# Patient Record
Sex: Male | Born: 1955 | Race: White | Hispanic: No | Marital: Married | Smoking: Never smoker
Health system: Southern US, Community
[De-identification: ages and names within clinical notes are randomized; demographics above are authoritative.]

## PROBLEM LIST (undated history)

## (undated) DIAGNOSIS — G2581 Restless legs syndrome: Secondary | ICD-10-CM

## (undated) DIAGNOSIS — G4733 Obstructive sleep apnea (adult) (pediatric): Secondary | ICD-10-CM

## (undated) HISTORY — DX: Restless legs syndrome: G25.81

## (undated) HISTORY — DX: Obstructive sleep apnea (adult) (pediatric): G47.33

## (undated) HISTORY — PX: KNEE ARTHROSCOPY: SHX127

---

## 2007-03-16 ENCOUNTER — Ambulatory Visit (HOSPITAL_BASED_OUTPATIENT_CLINIC_OR_DEPARTMENT_OTHER): Admission: RE | Admit: 2007-03-16 | Discharge: 2007-03-16 | Payer: Self-pay | Admitting: Otolaryngology

## 2007-03-22 ENCOUNTER — Ambulatory Visit: Payer: Self-pay | Admitting: Internal Medicine

## 2010-11-21 NOTE — Procedures (Signed)
NAME:  BRAVLIO, LUCA NO.:  [id number]   MEDICAL RECORD NO.:  [id number]          PATIENT TYPE:  OUT   LOCATION:  SLEEP CENTER                 FACILITY:  [facility]   PHYSICIAN:  Clinton D. Young, MD, FCCP, FACPDATE OF BIRTH:  03-Nov-1955   DATE OF STUDY:  03/16/2007                            NOCTURNAL POLYSOMNOGRAM   REFERRING PHYSICIAN:   INDICATION FOR STUDY:  Hypersomnia with sleep apnea, Epworth sleepiness  score 20/24, BMI 3.6, weight of 190 pounds, no home medications are  listed.   SLEEP ARCHITECTURE:  Total sleep time 289 minutes with sleep efficiency  71%.  Stage one was 11%, stage two was 77%, stage three was 1%, REM 10%  of total sleep time.  Sleep latency was 26%, REM latency 90%, awake  after sleep onset 97 minutes, arousal index increased to 23.4 indicating  increased EEG arousal.  No bedtime medication was taken.   RESPIRATORY DATA:  Split study protocol.  Apnea hypopnea index (AHI RDI)  37.9 obstructive events per hour, indicating moderately severe  obstructive sleep apnea/hypopnea syndrome before CPAP.  There were 17  obstructive apneas, one mixed apnea, and 72 hypopnea before CPAP.  The  events were predominantly while supine with most sleep before CPAP  introduction, also supine.  REM AHI 0.  CPAP was titrated to 8 CWP, AHI  0 per hour.  A small ResMed mirage micro nasal mask was used with heated  humidifier.   OXYGEN DATA:  Moderately loud snoring before CPAP with oxygen  desaturation 80 or 90%.  After CPAP control, saturation held 96% on room  air.   CARDIAC DATA:  Normal sinus rhythm.   MOVEMENT/PARASOMNIA:  Occasional limb jerk with insignificant impact on  sleep, no bathroom trips.   IMPRESSION/RECOMMENDATION:  1. Moderate obstructive sleep apnea/hypopnea syndrome, AHI 37.9 per      hour.  Positional events mostly recorded while supine, moderately      loud snoring with oxygen desaturating 80 or 90%.  2. Successful CPAP titration to  HCWP, AHI of 0 per hour.  A small      ResMed Mirage micro nasal mask was used with a heated humidifier.      Clinton D. Maple Hudson, MD, [facility], FACP  Diplomate, Biomedical engineer of Sleep Medicine  Electronically Signed     CDY/MEDQ  D:  03/22/2007 13:36:16  T:  03/23/2007 10:43:47  Job:  [id number]   cc:   Kristine Garbe. Ezzard Standing, M.D.  Fax: [phone]   Clinton D. Maple Hudson, MD, FCCP, FACP  [facility]-Pulmonary [facility]  [address]  [place]  [place] [address]

## 2013-06-30 ENCOUNTER — Ambulatory Visit: Payer: Self-pay | Admitting: Family Medicine

## 2014-06-20 ENCOUNTER — Ambulatory Visit: Payer: Self-pay

## 2018-02-08 ENCOUNTER — Ambulatory Visit
Admission: EM | Admit: 2018-02-08 | Discharge: 2018-02-08 | Disposition: A | Discharge status: Home / Self Care | Payer: BC Managed Care – PPO | Attending: Family Medicine | Admitting: Family Medicine

## 2018-02-08 ENCOUNTER — Other Ambulatory Visit: Payer: Self-pay

## 2018-02-08 ENCOUNTER — Ambulatory Visit: Payer: BC Managed Care – PPO

## 2018-02-08 DIAGNOSIS — M25531 Pain in right wrist: Secondary | ICD-10-CM

## 2018-02-08 DIAGNOSIS — M19039 Primary osteoarthritis, unspecified wrist: Secondary | ICD-10-CM

## 2018-02-08 DIAGNOSIS — X500XXA Overexertion from strenuous movement or load, initial encounter: Secondary | ICD-10-CM

## 2018-02-08 DIAGNOSIS — M19031 Primary osteoarthritis, right wrist: Secondary | ICD-10-CM

## 2018-02-08 MED ORDER — NAPROXEN 500 MG PO TABS: 500.0000 mg | ORAL_TABLET | Freq: Two times a day (BID) | ORAL | 0 refills | Status: DC

## 2018-02-08 MED ORDER — TRAMADOL HCL 50 MG PO TABS: 50.0000 mg | ORAL_TABLET | Freq: Four times a day (QID) | ORAL | 0 refills | Status: DC | PRN

## 2018-02-08 NOTE — Discharge Instructions (Signed)
Please wear thumb spica splint for 2 weeks and take naproxen twice daily with food for 2 weeks.  After 2 weeks transition of the brace if continued pain follow-up with primary care provider or orthopedics.  Use tramadol as needed for severe pain.

## 2018-02-08 NOTE — ED Provider Notes (Signed)
[facility]    CSN: [id number] Arrival date & time: 02/08/18  1014     History   Chief Complaint Chief Complaint  Patient presents with  . Wrist Pain    HPI Nathaniel Shaw is a 62 y.o. male presents to the urgent care facility for evaluation of right wrist pain.  Patient states yesterday he was lifting up a drive shaft when 60 to 80 pounds hyperextended his right wrist and he developed some pain and swelling throughout the wrist.  Patient states he had some discomfort and took Bayer aspirin.  Pain improved throughout the night but around 2 AM this morning pain returned.  Pain is been moderate.  No numbness or tingling.  Pain is throughout the dorsal aspect of the wrist and throughout the right hand.  He denies any other pain or injury throughout his body. HPI  History reviewed. No pertinent past medical history.  There are no active problems to display for this patient.   Past Surgical History:  Procedure Laterality Date  . KNEE ARTHROSCOPY         Home Medications    Prior to Admission medications   Medication Sig Start Date End Date Taking? Authorizing Provider  Aspirin-Caffeine (BAYER BACK & BODY PO) Take by mouth.   Yes [provider]  naproxen (NAPROSYN) 500 MG tablet Take 1 tablet (500 mg total) by mouth 2 (two) times daily. 02/08/18   Duanne Guess, PA-C  traMADol (ULTRAM) 50 MG tablet Take 1 tablet (50 mg total) by mouth every 6 (six) hours as needed. 02/08/18   Duanne Guess, PA-C    Family History Family History  Problem Relation Age of Onset  . Cancer Mother   . Healthy Father     Social History Social History   Tobacco Use  . Smoking status: Never Smoker  . Smokeless tobacco: Never Used  Substance Use Topics  . Alcohol use: Yes    Comment: occasional  . Drug use: Never     Allergies   Patient has no known allergies.   Review of Systems Review of Systems  Constitutional: Negative for fever.  Musculoskeletal:  Positive for arthralgias and joint swelling. Negative for back pain, gait problem, neck pain and neck stiffness.  Skin: Negative for rash and wound.  Neurological: Negative for numbness.     Physical Exam Triage Vital Signs ED Triage Vitals  Enc Vitals Group     BP 02/08/18 1027 (!) 145/95     Pulse Rate 02/08/18 1027 64     Resp 02/08/18 1027 16     Temp 02/08/18 1027 98.3 F (36.8 C)     Temp Source 02/08/18 1027 Temporal     SpO2 02/08/18 1027 100 %     Weight 02/08/18 1030 210 lb (95.3 kg)     Height 02/08/18 1030 5\' 9"  (1.753 m)     Head Circumference --      Peak Flow --      Pain Score 02/08/18 1028 7     Pain Loc --      Pain Edu? --      Excl. in [place]? --    No data found.  Updated Vital Signs BP (!) 145/95 (BP Location: Left Arm)   Pulse 64   Temp 98.3 F (36.8 C) (Temporal)   Resp 16   Ht 5\' 9"  (1.753 m)   Wt 210 lb (95.3 kg)   SpO2 100%   BMI 31.01 kg/m  Visual Acuity Right Eye Distance:   Left Eye Distance:   Bilateral Distance:    Right Eye Near:   Left Eye Near:    Bilateral Near:     Physical Exam  Constitutional: He is oriented to person, place, and time. He appears well-developed and well-nourished.  HENT:  Head: Normocephalic and atraumatic.  Eyes: Conjunctivae are normal.  Neck: Normal range of motion.  Cardiovascular: Normal rate.  Pulmonary/Chest: Effort normal. No respiratory distress.  Musculoskeletal: Normal range of motion.  Examination right wrist shows mild swelling throughout the wrist hand and digits.  There is no warmth erythema.  No skin breakdown noted.  Patient has good wrist range of motion with no significant pain with wrist range of motion.  There is no tendon deficits throughout the digits.  Patient is nontender throughout the forearm or elbow.  He has good range of motion of the elbow.  2+ radial pulse and 2+ cap refill.  Neurological: He is alert and oriented to person, place, and time.  Skin: Skin is warm. No rash  noted.  Psychiatric: He has a normal mood and affect. His behavior is normal. Thought content normal.     UC Treatments / Results  Labs (all labs ordered are listed, but only abnormal results are displayed) Labs Reviewed - No data to display  EKG None  Radiology Dg Wrist Complete Right  Result Date: 02/08/2018 CLINICAL DATA:  Right joint pain following hyperextension injury 2 days ago, initial encounter EXAM: RIGHT WRIST - COMPLETE 3+ VIEW COMPARISON:  None. FINDINGS: Degenerative changes are noted in the lateral aspect of the carpal bones. No acute fracture or dislocation is noted. Mild chondral calcifications are noted. No acute soft tissue abnormality is seen. IMPRESSION: Degenerative change without acute abnormality. Electronically Signed   By: Inez Catalina M.D.   On: 02/08/2018 11:09    Procedures Splint Application Date/Time: 09/14/4534 11:26 AM Performed by: Duanne Guess, PA-C Authorized by: Coral Spikes, DO   Consent:    Consent obtained:  Verbal   Consent given by:  Patient   Alternatives discussed:  No treatment Pre-procedure details:    Sensation:  Normal Procedure details:    Laterality:  Right   Location:  Wrist   Wrist:  R wrist   Strapping: no     Splint type:  Wrist   Supplies:  Prefabricated splint Post-procedure details:    Pain:  Improved   Sensation:  Normal   Patient tolerance of procedure:  Tolerated well, no immediate complications   (including critical care time)  Medications Ordered in UC Medications - No data to display  Initial Impression / Assessment and Plan / UC Course  I have reviewed the triage vital signs and the nursing notes.  Pertinent labs & imaging results that were available during my care of the patient were reviewed by me and considered in my medical decision making (see chart for details).     62 year old male with right wrist pain.  X-rays showed scaphotrapezial osteoarthritis with no evidence of acute bony  abnormality.  He is placed into a thumb spica splint.  He is placed on naproxen and given tramadol for nighttime pain as needed.  He is encouraged to continue with bracing and follow-up with orthopedics as needed if no improvement. Final Clinical Impressions(s) / UC Diagnoses   Final diagnoses:  Localized osteoarthritis of wrist     Discharge Instructions     Please wear thumb spica splint for 2 weeks and take  naproxen twice daily with food for 2 weeks.  After 2 weeks transition of the brace if continued pain follow-up with primary care provider or orthopedics.  Use tramadol as needed for severe pain.    ED Prescriptions    Medication Sig Dispense Auth. Provider   naproxen (NAPROSYN) 500 MG tablet Take 1 tablet (500 mg total) by mouth 2 (two) times daily. 30 tablet Duanne Guess, PA-C   traMADol (ULTRAM) 50 MG tablet Take 1 tablet (50 mg total) by mouth every 6 (six) hours as needed. 15 tablet Renata Caprice       Duanne Guess, [place] 02/08/18 1133

## 2018-02-08 NOTE — ED Triage Notes (Signed)
Patient dropped a drive shaft on his hand and it hyperflexed his wrist.

## 2018-07-14 ENCOUNTER — Other Ambulatory Visit: Payer: Self-pay

## 2018-07-14 ENCOUNTER — Encounter: Payer: Self-pay | Admitting: Emergency Medicine

## 2018-07-14 ENCOUNTER — Ambulatory Visit
Admission: EM | Admit: 2018-07-14 | Discharge: 2018-07-14 | Disposition: A | Discharge status: Home / Self Care | Payer: BC Managed Care – PPO | Attending: Family Medicine | Admitting: Family Medicine

## 2018-07-14 DIAGNOSIS — W268XXA Contact with other sharp object(s), not elsewhere classified, initial encounter: Secondary | ICD-10-CM | POA: Diagnosis not present

## 2018-07-14 DIAGNOSIS — S61213A Laceration without foreign body of left middle finger without damage to nail, initial encounter: Secondary | ICD-10-CM | POA: Insufficient documentation

## 2018-07-14 DIAGNOSIS — Z23 Encounter for immunization: Secondary | ICD-10-CM

## 2018-07-14 MED ORDER — TETANUS-DIPHTH-ACELL PERTUSSIS 5-2.5-18.5 LF-MCG/0.5 IM SUSP
0.5000 mL | Freq: Once | INTRAMUSCULAR | Status: AC
  Administered 2018-07-14: 0.5 mL via INTRAMUSCULAR

## 2018-07-14 MED ORDER — CEPHALEXIN 500 MG PO CAPS: 500.0000 mg | ORAL_CAPSULE | Freq: Three times a day (TID) | ORAL | 0 refills | Status: AC

## 2018-07-14 NOTE — ED Triage Notes (Signed)
Patient c/o left middle finger laceration this morning. He states he got it caught in a piece of metal.

## 2018-07-14 NOTE — ED Provider Notes (Signed)
[facility] ____________________________________________  Time seen: Approximately 2:17 PM  I have reviewed the triage vital signs and the nursing notes.   HISTORY  Chief Complaint Extremity Laceration   HPI Nathaniel Shaw is a 63 y.o. male presenting for evaluation of left middle finger laceration that occurred approximately 10 AM this morning.  Reports that this did occur at work, but declines Worker's Compensation injury.  States he was working on a new part and a piece of metal came down catching his skin causing a laceration.  States minimal discomfort to the area.  Denies any decreased range of motion, paresthesias or other discomfort.  Denies other injury.  Unsure of last tetanus immunization.  Right-hand-dominant.  Reports otherwise doing well denies other complaints.  Did apply cleaning solution on area prior to arrival.  No other alleviating measures attempted.  Denies other complaints.  [place]: PCP   History reviewed. No pertinent past medical history. Denies   There are no active problems to display for this patient.   Past Surgical History:  Procedure Laterality Date  . KNEE ARTHROSCOPY       No current facility-administered medications for this encounter.   Current Outpatient Medications:  .  cephALEXin (KEFLEX) 500 MG capsule, Take 1 capsule (500 mg total) by mouth 3 (three) times daily for 5 days., Disp: 15 capsule, Rfl: 0  Allergies Patient has no known allergies.  Family History  Problem Relation Age of Onset  . Cancer Mother   . Healthy Father     Social History Social History   Tobacco Use  . Smoking status: Never Smoker  . Smokeless tobacco: Never Used  Substance Use Topics  . Alcohol use: Yes    Comment: occasional  . Drug use: Never    Review of Systems Constitutional: No fever Cardiovascular: Denies chest pain. Respiratory: Denies shortness of breath. Gastrointestinal: No abdominal pain.     Musculoskeletal: Negative for back pain. Skin: as above.  ____________________________________________   PHYSICAL EXAM:  VITAL SIGNS: ED Triage Vitals  Enc Vitals Group     BP 07/14/18 1244 (!) 161/88     Pulse Rate 07/14/18 1244 67     Resp 07/14/18 1244 18     Temp 07/14/18 1244 98.3 F (36.8 C)     Temp Source 07/14/18 1244 Oral     SpO2 07/14/18 1244 99 %     Weight 07/14/18 1242 200 lb (90.7 kg)     Height 07/14/18 1242 5\' 9"  (1.753 m)     Head Circumference --      Peak Flow --      Pain Score 07/14/18 1242 0     Pain Loc --      Pain Edu? --      Excl. in [place]? --     Constitutional: Alert and oriented. Well appearing and in no acute distress.      Head: Normocephalic and atraumatic. Cardiovascular: Normal rate, regular rhythm. Grossly normal heart sounds.  Good peripheral circulation. Respiratory: Normal respiratory effort without tachypnea nor retractions. Breath sounds are clear and equal bilaterally. No wheezes, rales, rhonchi. Musculoskeletal: Steady gait. Neurologic:  Normal speech and language. Speech is normal. No gait instability.  Skin:  Skin is warm, dry. Except: Left distal middle phalanx volar aspect 1.5 cm superficial flap-like laceration present, no active bleeding, no foreign body found, nontender, no bony tenderness, normal distal sensation and capillary refill, full range of motion present, no motor or tendon deficit noted, skin  otherwise intact to hand, no injury to nail. Psychiatric: Mood and affect are normal. Speech and behavior are normal. Patient exhibits appropriate insight and judgment   ___________________________________________   LABS (all labs ordered are listed, but only abnormal results are displayed)  Labs Reviewed - No data to display ____________________________________________  PROCEDURES Procedures   Procedure(s) performed:  Procedure explained and verbal consent obtained. Consent: Verbal consent obtained. Written consent  not obtained. Risks and benefits: risks, benefits and alternatives were discussed Patient identity confirmed: verbally with patient and hospital-assigned identification number  Consent given by: patient   Laceration Repair Location: Left middle finger Length: 1.5 cm. Foreign bodies: no foreign bodies Tendon involvement: none Nerve involvement: none Preparation: Patient was prepped and draped in the usual sterile fashion. Anesthesia none  Cleaned with Betadine Irrigation solution: saline Irrigation method: jet lavage Amount of cleaning: copious Repaired with 1 Steri-Strip and Dermabond. Patient tolerate well. Wound well approximated post repair.  Antibiotic ointment and dressing applied.  Wound care instructions provided.  Observe for any signs of infection or other problems.     INITIAL IMPRESSION / ASSESSMENT AND PLAN / ED COURSE  Pertinent labs & imaging results that were available during my care of the patient were reviewed by me and considered in my medical decision making (see chart for details).  Well-appearing patient.  No acute distress.  Finger laceration cleaned, irrigated and repaired as above.  Will empirically place patient on 5-day course of Keflex.  Discussed keeping clean and dry.  Supportive care and monitoring.  Discussed return parameters.Discussed indication, risks and benefits of medications with patient.  Tetanus immunization updated.  Discussed follow up with Primary care physician this week as needed. Discussed follow up and return parameters including no resolution or any worsening concerns. Patient verbalized understanding and agreed to plan.   ____________________________________________   FINAL CLINICAL IMPRESSION(S) / ED DIAGNOSES  Final diagnoses:  Laceration of left middle finger without foreign body without damage to nail, initial encounter     ED Discharge Orders         Ordered    cephALEXin (KEFLEX) 500 MG capsule  3 times daily      07/14/18 1412           Note: This dictation was prepared with Dragon dictation along with smaller phrase technology. Any transcriptional errors that result from this process are unintentional.         Marylene Land, NP 07/14/18 1432

## 2018-07-14 NOTE — Discharge Instructions (Addendum)
Take medication as prescribed. Rest area. Keep clean and dry as discussed.   Follow up with your primary care physician this week as needed. Return to Urgent care for new or worsening concerns.

## 2019-10-16 ENCOUNTER — Ambulatory Visit: Payer: BC Managed Care – PPO | Attending: Internal Medicine

## 2019-10-16 DIAGNOSIS — Z23 Encounter for immunization: Secondary | ICD-10-CM

## 2019-10-16 NOTE — Progress Notes (Signed)
   Covid-19 Vaccination Clinic  Name:  Nathaniel Shaw    MRN: [id number] DOB: 1955/09/20  10/16/2019  Nathaniel Shaw was observed post Covid-19 immunization for 15 minutes without incident. He was provided with Vaccine Information Sheet and instruction to access the V-Safe system.   Nathaniel Shaw was instructed to call 911 with any severe reactions post vaccine: Marland Kitchen Difficulty breathing  . Swelling of face and throat  . A fast heartbeat  . A bad rash all over body  . Dizziness and weakness   Immunizations Administered    Name Date Dose VIS Date Route   Pfizer COVID-19 Vaccine 10/16/2019  9:42 AM 0.3 mL 06/19/2019 Intramuscular   Manufacturer: [place]   Lot: [id number]   [place]: [id number]

## 2019-11-11 ENCOUNTER — Ambulatory Visit: Payer: BC Managed Care – PPO | Attending: Internal Medicine

## 2019-11-11 ENCOUNTER — Other Ambulatory Visit: Payer: Self-pay

## 2019-11-11 DIAGNOSIS — Z23 Encounter for immunization: Secondary | ICD-10-CM

## 2019-11-11 NOTE — Progress Notes (Signed)
   Covid-19 Vaccination Clinic  Name:  Nathaniel Shaw    MRN: [id number] DOB: Dec 15, 1955  11/11/2019  Nathaniel Shaw was observed post Covid-19 immunization for 15 minutes without incident. He was provided with Vaccine Information Sheet and instruction to access the V-Safe system.   Nathaniel Shaw was instructed to call 911 with any severe reactions post vaccine: Marland Kitchen Difficulty breathing  . Swelling of face and throat  . A fast heartbeat  . A bad rash all over body  . Dizziness and weakness   Immunizations Administered    Name Date Dose VIS Date Route   Pfizer COVID-19 Vaccine 11/11/2019 10:01 AM 0.3 mL 09/02/2018 Intramuscular   Manufacturer: [place]   Lot: [id number]   [place]: [id number]

## 2019-12-15 ENCOUNTER — Encounter: Payer: Self-pay | Admitting: Emergency Medicine

## 2019-12-15 ENCOUNTER — Ambulatory Visit
Admission: EM | Admit: 2019-12-15 | Discharge: 2019-12-15 | Disposition: A | Discharge status: Home / Self Care | Payer: BC Managed Care – PPO | Attending: Emergency Medicine | Admitting: Emergency Medicine

## 2019-12-15 ENCOUNTER — Other Ambulatory Visit: Payer: Self-pay

## 2019-12-15 DIAGNOSIS — R0981 Nasal congestion: Secondary | ICD-10-CM | POA: Diagnosis present

## 2019-12-15 DIAGNOSIS — Z79899 Other long term (current) drug therapy: Secondary | ICD-10-CM | POA: Insufficient documentation

## 2019-12-15 DIAGNOSIS — Z20822 Contact with and (suspected) exposure to covid-19: Secondary | ICD-10-CM | POA: Diagnosis not present

## 2019-12-15 DIAGNOSIS — J302 Other seasonal allergic rhinitis: Secondary | ICD-10-CM

## 2019-12-15 DIAGNOSIS — R030 Elevated blood-pressure reading, without diagnosis of hypertension: Secondary | ICD-10-CM | POA: Diagnosis not present

## 2019-12-15 LAB — SARS CORONAVIRUS 2 (TAT 6-24 HRS): SARS Coronavirus 2: NEGATIVE

## 2019-12-15 MED ORDER — FLUTICASONE PROPIONATE 50 MCG/ACT NA SUSP: 2.0000 | Freq: Every day | NASAL | 0 refills | Status: DC

## 2019-12-15 MED ORDER — DOXYCYCLINE HYCLATE 100 MG PO CAPS
100.0000 mg | ORAL_CAPSULE | Freq: Two times a day (BID) | ORAL | 0 refills | Status: AC
Start: 2019-12-15 — End: 2019-12-22

## 2019-12-15 MED ORDER — IBUPROFEN 600 MG PO TABS: 600.0000 mg | ORAL_TABLET | Freq: Four times a day (QID) | ORAL | 0 refills | Status: DC | PRN

## 2019-12-15 NOTE — Discharge Instructions (Addendum)
Take the medication as written. Start an antihistamine such as Claritin, Allegra, or Zyrtec as this is most likely caused by allergies.  Return to the ER if you get worse, have a fever >100.4, or for any concerns. You may take 600 mg of motrin with 1 gram of tylenol up to 3-4 times a day as needed for pain. This is an effective combination for pain.  Do not start the antibiotics unless you have a fever above 102, severe symptoms such as facial swelling, have had this for 10 days total, or you get better and then get sick again. Use a NeilMed sinus rinse as often as you want to to reduce nasal congestion. Follow the directions on the box.   Go to www.goodrx.com to look up your medications. This will give you a list of where you can find your prescriptions at the most affordable prices. Or you can ask the pharmacist what the cash price is. This is frequently cheaper than going through insurance.    Decrease your salt intake. diet and exercise will lower your blood pressure significantly. It is important to keep your blood pressure under good control, as having a elevated blood pressure for prolonged periods of time significantly increases your risk of stroke, heart attacks, kidney damage, eye damage, and other problems. Measure your blood pressure once a day, preferably at the same time every day. Keep a log of this and bring it to your next doctor's appointment.  Bring your blood pressure cuff as well.  Return immediately to the ER if you start having chest pain, headache, problems seeing, problems talking, problems walking, if you feel like you're about to pass out, if you do pass out, if you have a seizure, or for any other concerns.

## 2019-12-15 NOTE — ED Provider Notes (Signed)
HPI  SUBJECTIVE:  Nathaniel Shaw is a 64 y.o. male who presents with clear nasal congestion, sinus pain and pressure, upper dental pain, mild body aches, mild sore throat and cough productive of clear mucus starting last night.  No facial swelling.  No fevers, headaches, loss of sense of smell or taste, shortness of breath, nausea, vomiting, diarrhea, abdominal pain.  No known Covid exposure.  He has received both Covid vaccines.  He also reports itchy, watery eyes, sneezing.  No antibiotics in the past month.  No antipyretic in the past 4 to 6 hours.  He is going on vacation on Thursday for a week to a [place].  He has tried over-the-counter Sudafed.  He has not tried any antihistamines.  No aggravating or alleviating factors.  He has a past medical history of allergies are bothering him at this time of year.  States that he gets sinusitis "every year at this time".  No history of hypertension, diabetes, coronary disease, chronic kidney disease, HIV, cancer, pulmonary disease, smoking.  PMD: [facility].   History reviewed. No pertinent past medical history.  Past Surgical History:  Procedure Laterality Date  . KNEE ARTHROSCOPY      Family History  Problem Relation Age of Onset  . Cancer Mother   . Healthy Father     Social History   Tobacco Use  . Smoking status: Never Smoker  . Smokeless tobacco: Never Used  Substance Use Topics  . Alcohol use: Yes    Comment: occasional  . Drug use: Never    No current facility-administered medications for this encounter.  Current Outpatient Medications:  .  doxycycline (VIBRAMYCIN) 100 MG capsule, Take 1 capsule (100 mg total) by mouth 2 (two) times daily for 7 days., Disp: 14 capsule, Rfl: 0 .  fluticasone (FLONASE) 50 MCG/ACT nasal spray, Place 2 sprays into both nostrils daily., Disp: 16 g, Rfl: 0 .  ibuprofen (ADVIL) 600 MG tablet, Take 1 tablet (600 mg total) by mouth every 6 (six) hours as needed., Disp: 30 tablet,  Rfl: 0  No Known Allergies   ROS  As noted in HPI.   Physical Exam  BP (!) 161/89 (BP Location: Right Arm)   Pulse 71   Temp 98.5 F (36.9 C) (Oral)   Resp 18   Ht 5\' 9"  (1.753 m)   Wt 93 kg   SpO2 98%   BMI 30.27 kg/m   Constitutional: Well developed, well nourished, no acute distress Eyes:  EOMI, conjunctiva normal bilaterally HENT: Normocephalic, atraumatic,mucus membranes moist. + clear nasal congestion. Swollen red turbinates. + mild maxillary sinus tenderness, - frontal sinus tenderness. Oropharynx normal + postnasal drip.  Respiratory: Normal inspiratory effort, lungs clear bilaterally Cardiovascular: Normal rate, regular rhythm no murmurs rubs or gallops GI: nondistended skin: No rash, skin intact Musculoskeletal: no deformities Neurologic: Alert & oriented x 3, no focal neuro deficits Psychiatric: Speech and behavior appropriate   ED Course   Medications - No data to display  Orders Placed This Encounter  Procedures  . SARS CORONAVIRUS 2 (TAT 6-24 HRS) Nasopharyngeal Nasopharyngeal Swab    Standing Status:   Standing    Number of Occurrences:   1    Order Specific Question:   Is this test for diagnosis or screening    Answer:   Diagnosis of ill patient    Order Specific Question:   Symptomatic for COVID-19 as defined by CDC    Answer:   Yes  Order Specific Question:   Date of Symptom Onset    Answer:   12/14/2019    Order Specific Question:   Hospitalized for COVID-19    Answer:   No    Order Specific Question:   Admitted to ICU for COVID-19    Answer:   No    Order Specific Question:   Previously tested for COVID-19    Answer:   No    Order Specific Question:   Resident in a congregate (group) care setting    Answer:   No    Order Specific Question:   Employed in healthcare setting    Answer:   No    Order Specific Question:   Has patient completed COVID vaccination(s) (2 doses of Pfizer/Moderna 1 dose of [employer])    Answer:   Yes     Results for orders placed or performed during the hospital encounter of 12/15/19 (from the past 24 hour(s))  SARS CORONAVIRUS 2 (TAT 6-24 HRS) Nasopharyngeal Nasopharyngeal Swab     Status: None   Collection Time: 12/15/19 12:58 PM   Specimen: Nasopharyngeal Swab  Result Value Ref Range   SARS Coronavirus 2 NEGATIVE NEGATIVE   No results found.  ED Clinical Impression  Seasonal allergies  Elevated blood pressure reading without diagnosis of hypertension   ED Assessment/Plan  Suspect allergic sinusitis.  No fevers >102, has had sx for < 10 days, no h/o double sickening. No historical or objective evidence of bacterial infection. No indication for abx.  However because patient is going out of town for a week will send home with a wait-and-see prescription of doxycycline.  Discussed with him indications for starting this.  Will start nasal steriods, Claritin/Zyrtec/Allegra, increase fluids, nasal saline irrigation,  tylenol/motrin prn pain.  Covid test sent.  No decongestants because patient's blood pressure is elevated today and was elevated on the past visit.  Discussed MDM and plan with pt. Pt agrees with plan.  Blood pressure noted.  Patient has no complaints.  Could be because of the Sudafed that he took prior to arrival.  Advised him to keep an eye on this.  Advised him to buy blood pressure cuff and to measure his blood pressure once a day, keep a log of this and follow-up with his primary care physician if it remains persistently elevated above 140/90.  Covid test negative.  *This clinic note was created using Dragon dictation software. Therefore, there may be occasional mistakes despite careful proofreading.  ?     Melynda Ripple, MD 12/16/19 [phone]

## 2019-12-15 NOTE — ED Triage Notes (Signed)
Patient c/o nasal drainage and congestion that started last night. Denies cough, denies fever. States he is getting ready to go on vacation and wanted to be seen. Patient refusing COVID testing. States he has not concerns for COVID and has had both vaccines.

## 2020-03-22 ENCOUNTER — Encounter: Payer: Self-pay | Admitting: Emergency Medicine

## 2020-03-22 ENCOUNTER — Ambulatory Visit
Admission: EM | Admit: 2020-03-22 | Discharge: 2020-03-22 | Disposition: A | Discharge status: Home / Self Care | Payer: BC Managed Care – PPO | Attending: Emergency Medicine | Admitting: Emergency Medicine

## 2020-03-22 ENCOUNTER — Other Ambulatory Visit: Payer: Self-pay

## 2020-03-22 DIAGNOSIS — T07XXXA Unspecified multiple injuries, initial encounter: Secondary | ICD-10-CM | POA: Diagnosis not present

## 2020-03-22 DIAGNOSIS — S0993XA Unspecified injury of face, initial encounter: Secondary | ICD-10-CM | POA: Diagnosis not present

## 2020-03-22 DIAGNOSIS — S0181XA Laceration without foreign body of other part of head, initial encounter: Secondary | ICD-10-CM | POA: Diagnosis not present

## 2020-03-22 MED ORDER — MUPIROCIN 2 % EX OINT: 1.0000 "application " | TOPICAL_OINTMENT | Freq: Three times a day (TID) | CUTANEOUS | 0 refills | Status: DC

## 2020-03-22 MED ORDER — IBUPROFEN 600 MG PO TABS: 600.0000 mg | ORAL_TABLET | Freq: Four times a day (QID) | ORAL | 0 refills | Status: DC | PRN

## 2020-03-22 NOTE — Discharge Instructions (Addendum)
Place the Bactroban on the scrape below your left lower lip.  Do not put Bactroban on the area that we glued.  Do not get the area that we glued wet for at least 3 to 4 days.  This will be totally healed in 4 to 5 days.  600 mg of ibuprofen combined with 1000 mg of Tylenol together 3-4 times a day as needed for pain.  Ice your lower lip and left face.

## 2020-03-22 NOTE — ED Provider Notes (Signed)
HPI  SUBJECTIVE:  Nathaniel Shaw is a 65 y.o. male who presents with a trip and fall onto a clean metal truck piece immediately prior to arrival.  States that he fell onto the truck part with his face, sustaining a laceration above his lip, scrape on his left jaw and scrape/laceration below his lower lip.  He is unsure if he has any lacerations inside of his mouth.  He reports mild pain in the sites and left jaw swelling in the area of abrasion.  No loss of consciousness, amnesia.  No trismus, dental injury.  He tried applying pressure with a wet towel with hemostasis.  No aggravating factors.  Past medical history negative for HIV, cancer, immunocompromise, diabetes.  He is not on any anticoagulants or antiplatelets.  His tetanus is up-to-date.  PMD: [facility].    History reviewed. No pertinent past medical history.  Past Surgical History:  Procedure Laterality Date  . KNEE ARTHROSCOPY      Family History  Problem Relation Age of Onset  . Cancer Mother   . Healthy Father     Social History   Tobacco Use  . Smoking status: Never Smoker  . Smokeless tobacco: Never Used  Vaping Use  . Vaping Use: Never used  Substance Use Topics  . Alcohol use: Yes    Comment: occasional  . Drug use: Never    No current facility-administered medications for this encounter.  Current Outpatient Medications:  .  fluticasone (FLONASE) 50 MCG/ACT nasal spray, Place 2 sprays into both nostrils daily., Disp: 16 g, Rfl: 0 .  ibuprofen (ADVIL) 600 MG tablet, Take 1 tablet (600 mg total) by mouth every 6 (six) hours as needed., Disp: 30 tablet, Rfl: 0 .  mupirocin ointment (BACTROBAN) 2 %, Apply 1 application topically 3 (three) times daily., Disp: 22 g, Rfl: 0  No Known Allergies   ROS  As noted in HPI.   Physical Exam  BP (!) 158/89 (BP Location: Right Arm)   Pulse 71   Temp 98.3 F (36.8 C) (Oral)   Resp 18   Ht 5\' 9"  (1.753 m)   Wt 93 kg   SpO2 100%   BMI 30.27 kg/m    Constitutional: Well developed, well nourished, no acute distress Eyes:  EOMI, conjunctiva normal bilaterally HENT: Normocephalic, mucus membranes moist. 2 mm laceration to upper lip.  Does not appear to be a through and through lesion.  No foreign body noted.  No laceration to the upper inner lip.  Small laceration inner lower lip.  +1 cm superficial laceration and abrasion with bruising inferior to left lower lip.  Positive contusions bilateral inner lips on the mucosa.  No dental trauma.  Tender swelling, contusion left jaw.  No crepitus.  No surrounding bony tenderness.  Skin intact.  No claudication, trismus.         Respiratory: Normal inspiratory effort Cardiovascular: Normal rate GI: nondistended skin: No rash, skin intact Musculoskeletal: no deformities Neurologic: Alert & oriented x 3, no focal neuro deficits Psychiatric: Speech and behavior appropriate   ED Course   Medications - No data to display  No orders of the defined types were placed in this encounter.   No results found for this or any previous visit (from the past 24 hour(s)). No results found.  ED Clinical Impression  1. Facial laceration, initial encounter   2. Multiple contusions   3. Facial injury, initial encounter      ED Assessment/Plan  Had patient  wash abrasions extensively out with soap and water.  Plan to Dermabond the top lip laceration as it continues to bleed.  doubt jaw fracture as there is no claudication, trismus..  Procedure note: Cleaned upper lip laceration with alcohol.  Placed 3 layers of Dermabond with close approximation of wound edges.  Patient tolerated procedure well.    Advised patient do not get this wet and to leave this alone as long as he can.  Bactroban to the abrasions inferior to his lip.  This was a completely clean car part, do not think that he needs oral antibiotics.  Ice, ibuprofen/Tylenol as needed for pain.  Follow-up with PMD as needed.  Discussed  MDM, treatment plan, and plan for follow-up with patient. Discussed sn/sx that should prompt return to the ED. patient agrees with plan.   Meds ordered this encounter  Medications  . mupirocin ointment (BACTROBAN) 2 %    Sig: Apply 1 application topically 3 (three) times daily.    Dispense:  22 g    Refill:  0  . ibuprofen (ADVIL) 600 MG tablet    Sig: Take 1 tablet (600 mg total) by mouth every 6 (six) hours as needed.    Dispense:  30 tablet    Refill:  0    *This clinic note was created using Lobby[employer]. Therefore, there may be occasional mistakes despite careful proofreading.   ?    Melynda Ripple, MD 03/23/20 1005

## 2020-03-22 NOTE — ED Triage Notes (Signed)
Patient c/o lip laceration to upper lip after he fell on a piece of metal today. Patient is up to date on tetanus.

## 2020-05-09 ENCOUNTER — Ambulatory Visit (INDEPENDENT_AMBULATORY_CARE_PROVIDER_SITE_OTHER): Payer: BC Managed Care – PPO | Admitting: Internal Medicine

## 2020-05-09 VITALS — BP 155/86 | Ht 69.0 in | Wt 208.0 lb

## 2020-05-09 DIAGNOSIS — Z9989 Dependence on other enabling machines and devices: Secondary | ICD-10-CM

## 2020-05-09 DIAGNOSIS — G2581 Restless legs syndrome: Secondary | ICD-10-CM

## 2020-05-09 DIAGNOSIS — G4733 Obstructive sleep apnea (adult) (pediatric): Secondary | ICD-10-CM | POA: Diagnosis not present

## 2020-05-09 DIAGNOSIS — Z7189 Other specified counseling: Secondary | ICD-10-CM

## 2020-05-09 NOTE — Patient Instructions (Signed)

## 2020-05-09 NOTE — Progress Notes (Signed)
[facility] [place], [place] [address]  Pulmonary Sleep Medicine   Office Visit Note  Patient Name: Nathaniel Shaw DOB: Mar 12, 1956 MRN [id number]    Chief Complaint: Obstructive Sleep Apnea visit  Brief History:  Nathaniel Shaw is seen today for follow up The patient has a 6 year history of sleep apnea. Patient is using PAP nightly.  The patient feels more rested after sleeping with PAP.  The patient reports significant from PAP use. Nathaniel Shaw reports waking at night and having difficulty returning to bed. Reported sleepiness is  improved and the Epworth Sleepiness Score is 6 out of 24. The patient does not take naps. The patient complains of the following: the air port exhaust.  The compliance download shows excellent compliance with an average use time of 7.2 hours. The AHI is 1.3  The patient reports occasional RLS symptoms. Nathaniel Shaw is not sure if this contributes to his night time awakenings.   ROS  General: (-) fever, (-) chills, (-) night sweat Nose and Sinuses: (-) nasal stuffiness or itchiness, (-) postnasal drip, (-) nosebleeds, (-) sinus trouble. Mouth and Throat: (-) sore throat, (-) hoarseness. Neck: (-) swollen glands, (-) enlarged thyroid, (-) neck pain. Respiratory: - cough, - shortness of breath, - wheezing. Neurologic: - numbness, - tingling. Psychiatric: - anxiety, - depression   Current Medication: Outpatient Encounter Medications as of 05/09/2020  Medication Sig   fluticasone (FLONASE) 50 MCG/ACT nasal spray Place 2 sprays into both nostrils daily.   ibuprofen (ADVIL) 600 MG tablet Take 1 tablet (600 mg total) by mouth every 6 (six) hours as needed.   mupirocin ointment (BACTROBAN) 2 % Apply 1 application topically 3 (three) times daily.   No facility-administered encounter medications on file as of 05/09/2020.    Surgical History: Past Surgical History:  Procedure Laterality Date   KNEE ARTHROSCOPY      Medical History: Past Medical  History:  Diagnosis Date   OSA on CPAP    Restless leg syndrome     Family History: Non contributory to the present illness  Social History: Social History   Socioeconomic History   Marital status: Married    Spouse name: Not on file   Number of children: Not on file   Years of education: Not on file   Highest education level: Not on file  Occupational History   Not on file  Tobacco Use   Smoking status: Never Smoker   Smokeless tobacco: Never Used  Vaping Use   Vaping Use: Never used  Substance and Sexual Activity   Alcohol use: Yes    Comment: occasional   Drug use: Never   Sexual activity: Not on file  Other Topics Concern   Not on file  Social History Narrative   Not on file   Social Determinants of Health   Financial Resource Strain:    Difficulty of Paying Living Expenses: Not on file  Food Insecurity:    Worried About Running Out of Food in the Last Year: Not on file   Ran Out of Food in the Last Year: Not on file  Transportation Needs:    Lack of Transportation (Medical): Not on file   Lack of Transportation (Non-Medical): Not on file  Physical Activity:    Days of Exercise per Week: Not on file   Minutes of Exercise per Session: Not on file  Stress:    Feeling of Stress : Not on file  Social Connections:    Frequency of Communication with  Friends and Family: Not on file   Frequency of Social Gatherings with Friends and Family: Not on file   Attends Religious Services: Not on file   Active Member of Clubs or Organizations: Not on file   Attends Archivist Meetings: Not on file   Marital Status: Not on file  Intimate Partner Violence:    Fear of Current or Ex-Partner: Not on file   Emotionally Abused: Not on file   Physically Abused: Not on file   Sexually Abused: Not on file    Vital Signs: Blood pressure (!) 155/86, height 5\' 9"  (1.753 m), weight 208 lb (94.3 kg), SpO2 96 %.  Examination: General  Appearance: The patient is well-developed, well-nourished, and in no distress. Neck Circumference: 42 Skin: Gross inspection of skin unremarkable. Head: normocephalic, no gross deformities. Eyes: no gross deformities noted. ENT: ears appear grossly normal Neurologic: Alert and oriented. No involuntary movements.    EPWORTH SLEEPINESS SCALE:  Scale:  (0)= no chance of dozing; (1)= slight chance of dozing; (2)= moderate chance of dozing; (3)= high chance of dozing  Chance  Situtation    Sitting and reading: 1    Watching TV: 2    Sitting Inactive in public: 0    As a passenger in car: 1      Lying down to rest: 2    Sitting and talking: 0    Sitting quielty after lunch: 0    In a car, stopped in traffic: 0   TOTAL SCORE:   6 out of 24    SLEEP STUDIES:  1. PSG 10/2013 AHI 17 SpO37min 83%   CPAP COMPLIANCE DATA:  Date Range: 05/06/19-05/04/20  Average Daily Use: 7.2 hours  Median Use: 7.1  compliance for > 4 Hours: 98%  AHI: 1.3 respiratory events per hour  Days Used: 359/365  Mask Leak: 15.5  95th Percentile Pressure: 10   LABS: No results found for this or any previous visit (from the past 2160 hour(s)).  Radiology: No results found.   Assessment and Plan: Patient Active Problem List   Diagnosis Date Noted   CPAP use counseling 05/09/2020   OSA on CPAP    Restless leg syndrome       The patient does tolerate PAP and reports significant benefit from PAP use. The patient was reminded how to clean and advised to utilize active thinking to prevent ruminating at night. The patient was also advised to check his iron and ferritin. The compliance is excellent. The apnea is well controlled.   1. OSA- continue excellent compliance 2. CPAP couseling-Discussed importance of adequate CPAP use as well as proper care and cleaning techniques of machine and all supplies. 3. RLS- check iron and ferritin--orders placed, will adjust plan of care as  indicated based on results. 4. HTN BP noted to be elevated follow with PCP  General Counseling: I have discussed the findings of the evaluation and examination with Nathaniel Shaw.  I have also discussed any further diagnostic evaluation thatmay be needed or ordered today. Nathaniel Shaw verbalizes understanding of the findings of todays visit. We also reviewed his medications today and discussed drug interactions and side effects including but not limited excessive drowsiness and altered mental states. We also discussed that there is always a risk not just to him but also people around him. Nathaniel Shaw has been encouraged to call the office with any questions or concerns that should arise related to todays visit.  Orders Placed This Encounter  Procedures   Fe+TIBC+Fer  I have personally obtained a history, examined the patient, evaluated laboratory and imaging results, formulated the assessment and plan and placed orders.   Richelle Ito Saunders Glance, PhD, FAASM  Diplomate, American Board of Sleep Medicine    Allyne Gee, MD [facility] Diplomate ABMS Pulmonary and Critical Care Medicine Sleep medicine

## 2020-05-12 ENCOUNTER — Ambulatory Visit
Admission: EM | Admit: 2020-05-12 | Discharge: 2020-05-12 | Disposition: A | Discharge status: Home / Self Care | Payer: BC Managed Care – PPO | Attending: Internal Medicine | Admitting: Internal Medicine

## 2020-05-12 ENCOUNTER — Encounter: Payer: Self-pay | Admitting: Emergency Medicine

## 2020-05-12 ENCOUNTER — Other Ambulatory Visit: Payer: Self-pay

## 2020-05-12 DIAGNOSIS — S81811A Laceration without foreign body, right lower leg, initial encounter: Secondary | ICD-10-CM | POA: Diagnosis not present

## 2020-05-12 NOTE — ED Triage Notes (Signed)
Patient states he dropped a drill on his right shin about 30 min ago. Patient is up to date on his tetanus.

## 2020-05-12 NOTE — Discharge Instructions (Signed)
Come back if area splits open or develop signs of infection

## 2020-05-12 NOTE — ED Provider Notes (Signed)
[facility]    CSN: [id number] Arrival date & time: 05/12/20  4010      History   Chief Complaint Chief Complaint  Patient presents with  . Laceration    HPI Nathaniel Shaw is a 64 y.o. male who presents with R shin laceration from a drill that was not sitting well on the magnate and when he passed by the drill fell and hit his R mid shin 30 min ago. He is up to date on his TD.     Past Medical History:  Diagnosis Date  . OSA on CPAP   . Restless leg syndrome     Patient Active Problem List   Diagnosis Date Noted  . CPAP use counseling 05/09/2020  . OSA on CPAP   . Restless leg syndrome     Past Surgical History:  Procedure Laterality Date  . KNEE ARTHROSCOPY         Home Medications    Prior to Admission medications   Medication Sig Start Date End Date Taking? Authorizing Provider  fluticasone (FLONASE) 50 MCG/ACT nasal spray Place 2 sprays into both nostrils daily. 12/15/19   Melynda Ripple, MD  ibuprofen (ADVIL) 600 MG tablet Take 1 tablet (600 mg total) by mouth every 6 (six) hours as needed. 03/22/20   Melynda Ripple, MD  mupirocin ointment (BACTROBAN) 2 % Apply 1 application topically 3 (three) times daily. 03/22/20   Melynda Ripple, MD    Family History Family History  Problem Relation Age of Onset  . Cancer Mother   . Healthy Father     Social History Social History   Tobacco Use  . Smoking status: Never Smoker  . Smokeless tobacco: Never Used  Vaping Use  . Vaping Use: Never used  Substance Use Topics  . Alcohol use: Yes    Comment: occasional  . Drug use: Never     Allergies   Patient has no known allergies.   Review of Systems Review of Systems  Constitutional:       Denies easy bleeding  Musculoskeletal: Negative for gait problem and joint swelling.  Skin: Positive for wound. Negative for color change and pallor.     Physical Exam Triage Vital Signs ED Triage Vitals  Enc Vitals Group     BP 05/12/20  0937 (!) 149/79     Pulse Rate 05/12/20 0937 72     Resp 05/12/20 0937 18     Temp 05/12/20 0937 98.3 F (36.8 C)     Temp Source 05/12/20 0937 Oral     SpO2 05/12/20 0937 99 %     Weight 05/12/20 0936 207 lb 14.3 oz (94.3 kg)     Height 05/12/20 0936 5\' 9"  (1.753 m)     Head Circumference --      Peak Flow --      Pain Score 05/12/20 0936 0     Pain Loc --      Pain Edu? --      Excl. in [place]? --    No data found.  Updated Vital Signs BP (!) 149/79 (BP Location: Right Arm)   Pulse 72   Temp 98.3 F (36.8 C) (Oral)   Resp 18   Ht 5\' 9"  (1.753 m)   Wt 207 lb 14.3 oz (94.3 kg)   SpO2 99%   BMI 30.70 kg/m   Visual Acuity Right Eye Distance:   Left Eye Distance:   Bilateral Distance:    Right Eye Near:  Left Eye Near:    Bilateral Near:     Physical Exam Vitals and nursing note reviewed.  Constitutional:      General: He is not in acute distress.    Appearance: He is not toxic-appearing.  HENT:     Right Ear: External ear normal.     Left Ear: External ear normal.  Eyes:     General: No scleral icterus.    Conjunctiva/sclera: Conjunctivae normal.  Pulmonary:     Effort: Pulmonary effort is normal.  Musculoskeletal:        General: Normal range of motion.     Cervical back: Neck supple.  Skin:    General: Skin is warm and dry.     Comments: R LOWER LEG- with irregular abrasion/ laceration of about 1.5 cm which is clean and not bleeding and located on center of his shin, more to the R of the tibia bone. He does not have tenderness on the bone.   Neurological:     Mental Status: He is alert and oriented to person, place, and time.     Gait: Gait normal.  Psychiatric:        Mood and Affect: Mood normal.        Behavior: Behavior normal.        Thought Content: Thought content normal.        Judgment: Judgment normal.      UC Treatments / Results  Labs (all labs ordered are listed, but only abnormal results are displayed) Labs Reviewed - No data to  display  EKG   Radiology No results found.  Procedures Laceration Repair  Date/Time: 05/12/2020 10:01 AM Performed by: Shelby Mattocks, PA-C Authorized by: Shelby Mattocks, PA-C   Consent:    Consent obtained:  Verbal   Consent given by:  Patient (has had glue procedure before) Laceration details:    Location:  Leg   Leg location:  R upper leg   Length (cm):  1.5 Repair type:    Repair type:  Simple Exploration:    Contaminated: no   Treatment:    Area cleansed with:  Saline and Shur-Clens   Amount of cleaning:  Standard   Visualized foreign bodies/material removed: no   Skin repair:    Repair method:  Tissue adhesive Approximation:    Approximation:  Close Post-procedure details:    Dressing:  Open (no dressing)   Patient tolerance of procedure:  Tolerated well, no immediate complications   (including critical care time)  Medications Ordered in UC Medications - No data to display  Initial Impression / Assessment and Plan / UC Course  I have reviewed the triage vital signs and the nursing notes. Wound care instructions and signs of infection reviewed with pt.  Final Clinical Impressions(s) / UC Diagnoses   Final diagnoses:  None   Discharge Instructions   None    ED Prescriptions    None     PDMP not reviewed this encounter.   Shelby Mattocks, PA-C 05/12/20 1004

## 2020-08-12 ENCOUNTER — Other Ambulatory Visit: Payer: Self-pay

## 2020-08-12 ENCOUNTER — Ambulatory Visit
Admission: EM | Admit: 2020-08-12 | Discharge: 2020-08-12 | Disposition: A | Discharge status: Home / Self Care | Payer: Medicare Other | Attending: Physician Assistant | Admitting: Physician Assistant

## 2020-08-12 ENCOUNTER — Encounter: Payer: Self-pay | Admitting: Emergency Medicine

## 2020-08-12 DIAGNOSIS — L03011 Cellulitis of right finger: Secondary | ICD-10-CM | POA: Diagnosis not present

## 2020-08-12 MED ORDER — CEPHALEXIN 500 MG PO CAPS: 1000.0000 mg | ORAL_CAPSULE | Freq: Two times a day (BID) | ORAL | 0 refills | Status: AC

## 2020-08-12 NOTE — ED Provider Notes (Signed)
[facility]    CSN: [id number] Arrival date & time: 08/12/20  0807      History   Chief Complaint Chief Complaint  Patient presents with  . Finger Pain    HPI Nathaniel Shaw is a 65 y.o. male presenting for increased redness and swelling of the distal right thumb x2 to 3 days.  Patient states that he wears off his hands and thinks it could have gotten infected somehow.  He says he does wear nitrile gloves when he works though.  He denies any associated fevers.  Denies any drainage from around the nail.  Says that the area is tender to touch.  Has full range of motion of the finger and denies any numbness, weakness or tingling.  Denies similar problem the past.  Has been applying Neosporin without relief.  Not taking any other over-the-counter medications.  Does not mention any history of recurrent skin infections.  No known history of MRSA.  No other complaints or concerns.  HPI  Past Medical History:  Diagnosis Date  . OSA on CPAP   . Restless leg syndrome     Patient Active Problem List   Diagnosis Date Noted  . CPAP use counseling 05/09/2020  . OSA on CPAP   . Restless leg syndrome     Past Surgical History:  Procedure Laterality Date  . KNEE ARTHROSCOPY         Home Medications    Prior to Admission medications   Medication Sig Start Date End Date Taking? Authorizing Provider  cephALEXin (KEFLEX) 500 MG capsule Take 2 capsules (1,000 mg total) by mouth 2 (two) times daily for 5 days. 08/12/20 08/17/20 Yes Laurene Footman B, PA-C  fluticasone (FLONASE) 50 MCG/ACT nasal spray Place 2 sprays into both nostrils daily. 12/15/19 05/12/20  Melynda Ripple, MD    Family History Family History  Problem Relation Age of Onset  . Cancer Mother   . Healthy Father     Social History Social History   Tobacco Use  . Smoking status: Never Smoker  . Smokeless tobacco: Never Used  Vaping Use  . Vaping Use: Never used  Substance Use Topics  . Alcohol use: Yes     Comment: occasional  . Drug use: Never     Allergies   Patient has no known allergies.   Review of Systems Review of Systems  Constitutional: Negative for fatigue and fever.  Musculoskeletal: Positive for joint swelling. Negative for arthralgias and myalgias.  Skin: Positive for color change. Negative for rash and wound.  Neurological: Negative for weakness and numbness.     Physical Exam Triage Vital Signs ED Triage Vitals  Enc Vitals Group     BP 08/12/20 0817 (!) 139/105     Pulse Rate 08/12/20 0817 71     Resp 08/12/20 0817 16     Temp 08/12/20 0817 97.9 F (36.6 C)     Temp Source 08/12/20 0817 Oral     SpO2 08/12/20 0817 98 %     Weight 08/12/20 0815 205 lb (93 kg)     Height 08/12/20 0815 5\' 9"  (1.753 m)     Head Circumference --      Peak Flow --      Pain Score 08/12/20 0814 7     Pain Loc --      Pain Edu? --      Excl. in [place]? --    No data found.  Updated Vital Signs BP (!) 139/105 (BP  Location: Left Arm)   Pulse 71   Temp 97.9 F (36.6 C) (Oral)   Resp 16   Ht 5\' 9"  (1.753 m)   Wt 205 lb (93 kg)   SpO2 98%   BMI 30.27 kg/m       Physical Exam Vitals and nursing note reviewed.  Constitutional:      General: He is not in acute distress.    Appearance: Normal appearance. He is well-developed and well-nourished. He is not ill-appearing.  HENT:     Head: Normocephalic and atraumatic.  Eyes:     General: No scleral icterus.    Conjunctiva/sclera: Conjunctivae normal.  Cardiovascular:     Rate and Rhythm: Normal rate and regular rhythm.     Pulses: Normal pulses.  Pulmonary:     Effort: Pulmonary effort is normal. No respiratory distress.  Musculoskeletal:        General: No edema.     Cervical back: Neck supple.  Skin:    General: Skin is warm and dry.     Findings: Erythema (there is mild erythema of the dorsal distal right thumb. Area is tender to palpation. No fluctuance or drainage. Full ROM of finger) present.  Neurological:      General: No focal deficit present.     Mental Status: He is alert. Mental status is at baseline.     Motor: No weakness.     Gait: Gait normal.  Psychiatric:        Mood and Affect: Mood and affect and mood normal.        Behavior: Behavior normal.        Thought Content: Thought content normal.      UC Treatments / Results  Labs (all labs ordered are listed, but only abnormal results are displayed) Labs Reviewed - No data to display  EKG   Radiology No results found.  Procedures Procedures (including critical care time)  Medications Ordered in UC Medications - No data to display  Initial Impression / Assessment and Plan / UC Course  I have reviewed the triage vital signs and the nursing notes.  Pertinent labs & imaging results that were available during my care of the patient were reviewed by me and considered in my medical decision making (see chart for details).   Exam is consistent with mild cellulitis of the right thumb.  Treating with Keflex at this time.  Also advised at home supportive care with warm water soaks.  Advised can take Tylenol or Motrin as needed for any pain relief.  Discussed return and ED precautions with patient, including if he develops a fever, increased redness or swelling or increased pain.  Patient understanding and agreeable.   Final Clinical Impressions(s) / UC Diagnoses   Final diagnoses:  Cellulitis of right thumb     Discharge Instructions     There is a minor skin infection around your nail. Start and complete the antibiotics prescribed. Perform warm water soaks at home a couple times per day. Make sure to keep clean and dry. Can take tylenol or ibuprofen for pain relief if needed.  Return to our department for any worsening symptoms or if not getting better after antibiotics.    ED Prescriptions    Medication Sig Dispense Auth. Provider   cephALEXin (KEFLEX) 500 MG capsule Take 2 capsules (1,000 mg total) by mouth 2 (two) times  daily for 5 days. 20 capsule Danton Clap, PA-C     PDMP not reviewed this encounter.  Danton Clap, PA-C 08/12/20 [phone]

## 2020-08-12 NOTE — Discharge Instructions (Signed)
There is a minor skin infection around your nail. Start and complete the antibiotics prescribed. Perform warm water soaks at home a couple times per day. Make sure to keep clean and dry. Can take tylenol or ibuprofen for pain relief if needed.  Return to our department for any worsening symptoms or if not getting better after antibiotics.

## 2020-08-12 NOTE — ED Triage Notes (Signed)
Patient c/o redness, swelling and pain in his right thumb that started on Wed.

## 2020-09-23 ENCOUNTER — Ambulatory Visit
Admission: EM | Admit: 2020-09-23 | Discharge: 2020-09-23 | Disposition: A | Discharge status: Home / Self Care | Payer: Medicare Other

## 2020-09-23 ENCOUNTER — Ambulatory Visit (INDEPENDENT_AMBULATORY_CARE_PROVIDER_SITE_OTHER): Payer: Medicare Other

## 2020-09-23 ENCOUNTER — Other Ambulatory Visit: Payer: Self-pay

## 2020-09-23 DIAGNOSIS — M79651 Pain in right thigh: Secondary | ICD-10-CM | POA: Diagnosis not present

## 2020-09-23 DIAGNOSIS — W182XXA Fall in (into) shower or empty bathtub, initial encounter: Secondary | ICD-10-CM | POA: Diagnosis not present

## 2020-09-23 DIAGNOSIS — S8011XA Contusion of right lower leg, initial encounter: Secondary | ICD-10-CM

## 2020-09-23 NOTE — ED Notes (Signed)
Ace wrap applied by provider

## 2020-09-23 NOTE — ED Provider Notes (Signed)
[facility]    CSN: [id number] Arrival date & time: 09/23/20  0800      History   Chief Complaint Chief Complaint  Patient presents with  . Leg Injury    HPI Nathaniel Shaw is a 65 y.o. male presenting for pain, swelling, and bruising of the right lateral thigh x6 days.  Patient says that he stretches when he is in the shower and ended up slipping and falling during his stretches in the shower.  He says that his leg landed on a shower edge.  He says he has had persistent pain since.  Denies any significant pain with weightbearing or moving his hip or knee joints.  He says that he has the most pain when the area of bruising and swelling is touched or he lies on that side.  He has occasionally taken Bayer back and body without much improvement in the pain.  He says he has not even taken any since yesterday.  He denies any back pain or numbness, tingling or weakness.  He denies ever hitting his head or losing consciousness.  Denies any worsening of his symptoms and says that the bruising has improved, but the swelling and pain have not.  Patient is not taking any anticoagulant medications.  He has no other concerns today.  HPI  Past Medical History:  Diagnosis Date  . OSA on CPAP   . Restless leg syndrome     Patient Active Problem List   Diagnosis Date Noted  . CPAP use counseling 05/09/2020  . OSA on CPAP   . Restless leg syndrome     Past Surgical History:  Procedure Laterality Date  . KNEE ARTHROSCOPY         Home Medications    Prior to Admission medications   Medication Sig Start Date End Date Taking? Authorizing Provider  azelastine (ASTELIN) 0.1 % nasal spray Place into the nose. 06/28/17   [provider]  fluticasone (FLONASE) 50 MCG/ACT nasal spray Place 2 sprays into both nostrils daily. 12/15/19 05/12/20  Melynda Ripple, MD    Family History Family History  Problem Relation Age of Onset  . Cancer Mother   . Healthy Father      Social History Social History   Tobacco Use  . Smoking status: Never Smoker  . Smokeless tobacco: Never Used  Vaping Use  . Vaping Use: Never used  Substance Use Topics  . Alcohol use: Yes    Comment: occasional  . Drug use: Never     Allergies   Patient has no known allergies.   Review of Systems Review of Systems  Constitutional: Negative for fatigue.  Eyes: Negative for visual disturbance.  Respiratory: Negative for shortness of breath.   Cardiovascular: Negative for chest pain.  Gastrointestinal: Negative for nausea and vomiting.  Musculoskeletal: Positive for arthralgias. Negative for back pain, gait problem and joint swelling.  Skin: Negative for color change and wound.  Neurological: Negative for syncope, weakness and numbness.  Hematological: Does not bruise/bleed easily.     Physical Exam Triage Vital Signs ED Triage Vitals  Enc Vitals Group     BP 09/23/20 0815 (!) 154/87     Pulse Rate 09/23/20 0815 69     Resp 09/23/20 0815 18     Temp 09/23/20 0815 98.5 F (36.9 C)     Temp Source 09/23/20 0815 Oral     SpO2 09/23/20 0815 100 %     Weight --  Height --      Head Circumference --      Peak Flow --      Pain Score 09/23/20 0812 8     Pain Loc --      Pain Edu? --      Excl. in [place]? --    No data found.  Updated Vital Signs BP (!) 154/87 (BP Location: Left Arm)   Pulse 69   Temp 98.5 F (36.9 C) (Oral)   Resp 18   SpO2 100%       Physical Exam Vitals and nursing note reviewed.  Constitutional:      General: He is not in acute distress.    Appearance: Normal appearance. He is well-developed. He is not ill-appearing.  HENT:     Head: Normocephalic and atraumatic.  Eyes:     General: No scleral icterus.    Conjunctiva/sclera: Conjunctivae normal.  Cardiovascular:     Rate and Rhythm: Normal rate and regular rhythm.     Heart sounds: Normal heart sounds.  Pulmonary:     Effort: Pulmonary effort is normal. No respiratory  distress.     Breath sounds: Normal breath sounds.  Abdominal:     Palpations: Abdomen is soft.  Musculoskeletal:     Cervical back: Neck supple.     Comments: Right lower extremity: There is diffuse ecchymosis and swelling of the right lateral upper leg.  No bruising below the knee.  Tenderness throughout the right lateral thigh in the area of bruising.  Full range of motion without pain of the hip and knee.  No gait abnormality.  Good strength throughout.  Skin:    General: Skin is warm and dry.  Neurological:     General: No focal deficit present.     Mental Status: He is alert. Mental status is at baseline.     Motor: No weakness.     Gait: Gait normal.  Psychiatric:        Mood and Affect: Mood normal.        Behavior: Behavior normal.        Thought Content: Thought content normal.      UC Treatments / Results  Labs (all labs ordered are listed, but only abnormal results are displayed) Labs Reviewed - No data to display  EKG   Radiology DG Femur Min 2 Views Right  Result Date: 09/23/2020 CLINICAL DATA:  Right thigh pain after fall several days ago. EXAM: RIGHT FEMUR 2 VIEWS COMPARISON:  None. FINDINGS: There is no evidence of fracture or other focal bone lesions. Soft tissues are unremarkable. IMPRESSION: Negative. Electronically Signed   By: Marijo Conception M.D.   On: 09/23/2020 08:56    Procedures Procedures (including critical care time)  Medications Ordered in UC Medications - No data to display  Initial Impression / Assessment and Plan / UC Course  I have reviewed the triage vital signs and the nursing notes.  Pertinent labs & imaging results that were available during my care of the patient were reviewed by me and considered in my medical decision making (see chart for details).   65 year old male presenting for bruising, swelling and pain of the right lateral thigh for about 6 days.  Patient had a fall in shower.  X-ray of right femur obtained out of  concern for possible fracture of femur.  Other DDx is hematoma.  X-ray reveals no acute findings.  No acute bony abnormality.  Reviewed results with patient.  Advised patient that  he has a hematoma which is causing the pain.  Advised supportive care with cryotherapy and also heat therapy to help the blood reabsorb.  Advised increasing rest and fluids as well.  Tylenol for pain relief.  Follow-up with our department as needed.  ED precautions reviewed.   Final Clinical Impressions(s) / UC Diagnoses   Final diagnoses:  Hematoma of leg, right, initial encounter  Right thigh pain     Discharge Instructions     HEMATOMA/LEG PAIN: X-rays are normal today.  Stressed avoiding painful activities . Reviewed RICE guidelines. Use medications as directed, including NSAIDs. If no NSAIDs have been prescribed for you today, you may take Aleve or Motrin over the counter. May use Tylenol in between doses of NSAIDs.  If no improvement in the next 1-2 weeks, f/u with PCP or return to our office for reexamination, and please feel free to call or return at any time for any questions or concerns you may have and we will be happy to help you!        ED Prescriptions    None     I have reviewed the PDMP during this encounter.   Danton Clap, PA-C 09/23/20 [phone]

## 2020-09-23 NOTE — ED Triage Notes (Addendum)
Pt c/o right lateral thigh pain s/p fall last Saturday. Pt states he slipped and fell in shower and landed with right lateral leg on shower edge/lip.   Denies LOC, head trauma or other injury, numbness to feet/lower leg. Pt states bruising has improved, but pain has remained. States able to ambulate, cross right knee across midline in flex position w/o increased pain. States pain worse with light/moderate palpation to area.  Last dose for pain was yesterday:  Bayer "back and body" w/o improvement of pain.

## 2020-09-23 NOTE — Discharge Instructions (Signed)
HEMATOMA/LEG PAIN: X-rays are normal today.  Stressed avoiding painful activities . Reviewed RICE guidelines. Use medications as directed, including NSAIDs. If no NSAIDs have been prescribed for you today, you may take Aleve or Motrin over the counter. May use Tylenol in between doses of NSAIDs.  If no improvement in the next 1-2 weeks, f/u with PCP or return to our office for reexamination, and please feel free to call or return at any time for any questions or concerns you may have and we will be happy to help you!

## 2020-11-04 ENCOUNTER — Encounter: Payer: Self-pay | Admitting: Emergency Medicine

## 2020-11-04 ENCOUNTER — Other Ambulatory Visit: Payer: Self-pay

## 2020-11-04 ENCOUNTER — Ambulatory Visit
Admission: EM | Admit: 2020-11-04 | Discharge: 2020-11-04 | Disposition: A | Discharge status: Home / Self Care | Payer: Medicare Other | Attending: Sports Medicine | Admitting: Sports Medicine

## 2020-11-04 DIAGNOSIS — Z28311 Partially vaccinated for covid-19: Secondary | ICD-10-CM | POA: Insufficient documentation

## 2020-11-04 DIAGNOSIS — J069 Acute upper respiratory infection, unspecified: Secondary | ICD-10-CM | POA: Insufficient documentation

## 2020-11-04 DIAGNOSIS — R0981 Nasal congestion: Secondary | ICD-10-CM | POA: Diagnosis present

## 2020-11-04 DIAGNOSIS — Z20822 Contact with and (suspected) exposure to covid-19: Secondary | ICD-10-CM | POA: Diagnosis not present

## 2020-11-04 DIAGNOSIS — R051 Acute cough: Secondary | ICD-10-CM | POA: Insufficient documentation

## 2020-11-04 DIAGNOSIS — J302 Other seasonal allergic rhinitis: Secondary | ICD-10-CM | POA: Diagnosis not present

## 2020-11-04 DIAGNOSIS — J3489 Other specified disorders of nose and nasal sinuses: Secondary | ICD-10-CM

## 2020-11-04 LAB — SARS CORONAVIRUS 2 (TAT 6-24 HRS): SARS Coronavirus 2: NEGATIVE

## 2020-11-04 MED ORDER — CETIRIZINE-PSEUDOEPHEDRINE ER 5-120 MG PO TB12: 1.0000 | ORAL_TABLET | Freq: Every day | ORAL | 0 refills | Status: DC

## 2020-11-04 MED ORDER — FLUTICASONE PROPIONATE 50 MCG/ACT NA SUSP: 1.0000 | Freq: Every day | NASAL | 0 refills | Status: DC

## 2020-11-04 NOTE — Discharge Instructions (Addendum)
You been diagnosed with an upper respiratory infection that is viral in nature.  No antibiotics are indicated. I did send some medications to your pharmacy including a nasal spray, and allergy medicine, and a dextromethorphan component to help with cough and congestion. We also did a COVID test which is pending at the time of discharge.  Someone will call you if that test result is positive. Continue with over-the-counter meds as needed, Tylenol or Motrin for any fever or discomfort. Plenty of rest, plenty of fluids. Educational handouts were provided.  Read them when you get a chance. If your symptoms persist please see your primary care physician.  If they worsen then you can come back here or go to the ER.  I hope you get to feeling better, Dr. Drema Dallas

## 2020-11-04 NOTE — ED Triage Notes (Signed)
Patient cough, chest congestion and nasal congestion for over a week.  Patient denies fevers.

## 2020-11-04 NOTE — ED Provider Notes (Signed)
[facility]    CSN: [id number] Arrival date & time: 11/04/20  0801      History   Chief Complaint Chief Complaint  Patient presents with  . Cough  . Nasal Congestion    HPI Nathaniel Shaw is a 65 y.o. male.   Pleasant 65 year old male who presents for evaluation of the above issue.  Normally sees is [facility] in [place].  Unable to get into see them today.  Nathaniel Shaw reports nasal congestion, rhinorrhea, and cough for about a week now.  No fever shakes chills.  No history of asthma.  Nathaniel Shaw is generally healthy does not take meds on a regular basis.  Nathaniel Shaw works as a Engineer, building services.  Nathaniel Shaw says that Nathaniel Shaw is off on a Friday so Nathaniel Shaw came into the urgent care just to be evaluated today.  Nathaniel Shaw has not missed any work.  No chest pain or shortness of breath.  Nathaniel Shaw has been vaccinated x2, no booster.  No flu shot this year.  No COVID history of COVID exposure.  Nathaniel Shaw has a question of seasonal allergies but does not take any medicines for them.  No urinary symptoms or abdominal symptoms.  No red flag signs or symptoms elicited on history.     Past Medical History:  Diagnosis Date  . OSA on CPAP   . Restless leg syndrome     Patient Active Problem List   Diagnosis Date Noted  . CPAP use counseling 05/09/2020  . OSA on CPAP   . Restless leg syndrome     Past Surgical History:  Procedure Laterality Date  . KNEE ARTHROSCOPY         Home Medications    Prior to Admission medications   Medication Sig Start Date End Date Taking? Authorizing Provider  cetirizine-pseudoephedrine (ZYRTEC-D) 5-120 MG tablet Take 1 tablet by mouth daily. 11/04/20  Yes Verda Cumins, MD  fluticasone [facility]) 50 MCG/ACT nasal spray Place 1 spray into both nostrils daily. 11/04/20  Yes Verda Cumins, MD  azelastine (ASTELIN) 0.1 % nasal spray Place into the nose. 06/28/17 11/04/20  [provider]    Family History Family History  Problem Relation Age of Onset  . Cancer Mother   . Healthy  Father     Social History Social History   Tobacco Use  . Smoking status: Never Smoker  . Smokeless tobacco: Never Used  Vaping Use  . Vaping Use: Never used  Substance Use Topics  . Alcohol use: Yes    Comment: occasional  . Drug use: Never     Allergies   Patient has no known allergies.   Review of Systems Review of Systems  Constitutional: Negative.  Negative for activity change, chills, diaphoresis, fatigue and fever.  HENT: Positive for congestion and rhinorrhea. Negative for ear discharge, ear pain, sinus pressure, sinus pain and sneezing.   Eyes: Negative.  Negative for pain.  Respiratory: Positive for cough. Negative for chest tightness, wheezing and stridor.   Cardiovascular: Negative.  Negative for chest pain and palpitations.  Gastrointestinal: Negative.  Negative for abdominal pain, constipation, diarrhea, nausea and vomiting.  Genitourinary: Negative.  Negative for dysuria, flank pain, frequency, hematuria and urgency.  Musculoskeletal: Negative.  Negative for arthralgias, back pain, myalgias, neck pain and neck stiffness.  Skin: Negative.  Negative for color change, pallor, rash and wound.  Allergic/Immunologic: Positive for environmental allergies.  Neurological: Negative.  Negative for dizziness, light-headedness and headaches.  All other systems reviewed and are negative.  Physical Exam Triage Vital Signs ED Triage Vitals  Enc Vitals Group     BP 11/04/20 0811 (!) 167/91     Pulse Rate 11/04/20 0811 66     Resp 11/04/20 0811 16     Temp 11/04/20 0811 98.6 F (37 C)     Temp Source 11/04/20 0811 Oral     SpO2 11/04/20 0811 98 %     Weight 11/04/20 0808 205 lb (93 kg)     Height 11/04/20 0808 5\' 9"  (1.753 m)     Head Circumference --      Peak Flow --      Pain Score 11/04/20 0808 0     Pain Loc --      Pain Edu? --      Excl. in [place]? --    No data found.  Updated Vital Signs BP (!) 167/91 (BP Location: Right Arm)   Pulse 66   Temp 98.6  F (37 C) (Oral)   Resp 16   Ht 5\' 9"  (1.753 m)   Wt 93 kg   SpO2 98%   BMI 30.27 kg/m   Visual Acuity Right Eye Distance:   Left Eye Distance:   Bilateral Distance:    Right Eye Near:   Left Eye Near:    Bilateral Near:     Physical Exam Vitals and nursing note reviewed.  Constitutional:      General: Nathaniel Shaw is not in acute distress.    Appearance: Normal appearance. Nathaniel Shaw is not ill-appearing, toxic-appearing or diaphoretic.  HENT:     Head: Normocephalic and atraumatic.     Nose: Congestion and rhinorrhea present.     Mouth/Throat:     Mouth: Mucous membranes are moist.     Pharynx: No oropharyngeal exudate or posterior oropharyngeal erythema.  Eyes:     General: No scleral icterus.       Right eye: No discharge.        Left eye: No discharge.     Extraocular Movements: Extraocular movements intact.     Conjunctiva/sclera: Conjunctivae normal.     Pupils: Pupils are equal, round, and reactive to light.  Cardiovascular:     Rate and Rhythm: Normal rate and regular rhythm.     Pulses: Normal pulses.     Heart sounds: Normal heart sounds. No murmur heard. No friction rub. No gallop.   Pulmonary:     Effort: Pulmonary effort is normal.     Breath sounds: Normal breath sounds. No stridor. No wheezing, rhonchi or rales.  Musculoskeletal:     Cervical back: Normal range of motion and neck supple. No rigidity or tenderness.  Lymphadenopathy:     Cervical: Cervical adenopathy present.  Skin:    General: Skin is warm.     Capillary Refill: Capillary refill takes less than 2 seconds.     Coloration: Skin is not pale.     Findings: No bruising, erythema, lesion or rash.  Neurological:     General: No focal deficit present.     Mental Status: Nathaniel Shaw is alert and oriented to person, place, and time.      UC Treatments / Results  Labs (all labs ordered are listed, but only abnormal results are displayed) Labs Reviewed  SARS CORONAVIRUS 2 (TAT 6-24 HRS)     EKG   Radiology No results found.  Procedures Procedures (including critical care time)  Medications Ordered in UC Medications - No data to display  Initial Impression / Assessment and Plan /  UC Course  I have reviewed the triage vital signs and the nursing notes.  Pertinent labs & imaging results that were available during my care of the patient were reviewed by me and considered in my medical decision making (see chart for details).  Clinical impression: Nasal congestion cough and rhinorrhea for a week.  Consistent with a viral URI versus seasonal allergies.  We will treat accordingly.  Treatment plan: 1.  The findings and treatment plan were discussed in detail with the patient.  Patient was in agreement. 2.  Nathaniel Shaw was asking for a Z-Pak but that was not clinically indicated.  I will believe Nathaniel Shaw has a bacterial process. 3.  Will prescribe Flonase and Zyrtec-D to assist with his symptoms. 4.  Educational handouts provided. 5.  We will also check a COVID test per his request.  Results were pending at the time of discharge. 6.  Over-the-counter meds as needed, Tylenol or Motrin for fever discomfort.  Plenty of rest and plenty fluids. 7.  Nathaniel Shaw did not need a work note. 8.  If his symptoms persist Nathaniel Shaw should see his primary care provider.  If they worsen Nathaniel Shaw should come back here for reevaluation or go to the emergency room. 9.  Nathaniel Shaw was discharged in stable condition.  Nathaniel Shaw will follow-up here as needed.    Final Clinical Impressions(s) / UC Diagnoses   Final diagnoses:  Viral URI with cough  Nasal congestion  Rhinorrhea  Seasonal allergies     Discharge Instructions     You been diagnosed with an upper respiratory infection that is viral in nature.  No antibiotics are indicated. I did send some medications to your pharmacy including a nasal spray, and allergy medicine, and a dextromethorphan component to help with cough and congestion. We also did a COVID test which is pending  at the time of discharge.  Someone will call you if that test result is positive. Continue with over-the-counter meds as needed, Tylenol or Motrin for any fever or discomfort. Plenty of rest, plenty of fluids. Educational handouts were provided.  Read them when you get a chance. If your symptoms persist please see your primary care physician.  If they worsen then you can come back here or go to the ER.  I hope you get to feeling better, Dr. Drema Dallas    ED Prescriptions    Medication Sig Dispense Auth. Provider   fluticasone (FLONASE) 50 MCG/ACT nasal spray Place 1 spray into both nostrils daily. 15.8 mL Verda Cumins, MD   cetirizine-pseudoephedrine (ZYRTEC-D) 5-120 MG tablet Take 1 tablet by mouth daily. 30 tablet Verda Cumins, MD     PDMP not reviewed this encounter.   Verda Cumins, MD 11/04/20 [phone]

## 2021-01-03 ENCOUNTER — Other Ambulatory Visit: Payer: Self-pay

## 2021-01-03 ENCOUNTER — Ambulatory Visit
Admission: EM | Admit: 2021-01-03 | Discharge: 2021-01-03 | Disposition: A | Discharge status: Home / Self Care | Payer: Medicare Other | Attending: Sports Medicine | Admitting: Sports Medicine

## 2021-01-03 DIAGNOSIS — R059 Cough, unspecified: Secondary | ICD-10-CM

## 2021-01-03 DIAGNOSIS — R0981 Nasal congestion: Secondary | ICD-10-CM | POA: Diagnosis not present

## 2021-01-03 DIAGNOSIS — M791 Myalgia, unspecified site: Secondary | ICD-10-CM | POA: Diagnosis present

## 2021-01-03 DIAGNOSIS — U071 COVID-19: Secondary | ICD-10-CM | POA: Insufficient documentation

## 2021-01-03 DIAGNOSIS — Z28311 Partially vaccinated for covid-19: Secondary | ICD-10-CM | POA: Diagnosis not present

## 2021-01-03 DIAGNOSIS — B349 Viral infection, unspecified: Secondary | ICD-10-CM

## 2021-01-03 DIAGNOSIS — J028 Acute pharyngitis due to other specified organisms: Secondary | ICD-10-CM

## 2021-01-03 DIAGNOSIS — Z79899 Other long term (current) drug therapy: Secondary | ICD-10-CM | POA: Insufficient documentation

## 2021-01-03 DIAGNOSIS — B9789 Other viral agents as the cause of diseases classified elsewhere: Secondary | ICD-10-CM

## 2021-01-03 DIAGNOSIS — R509 Fever, unspecified: Secondary | ICD-10-CM | POA: Diagnosis present

## 2021-01-03 DIAGNOSIS — R52 Pain, unspecified: Secondary | ICD-10-CM | POA: Diagnosis present

## 2021-01-03 LAB — INFLUENZA A AND B ANTIGEN (CONVERTED LAB)
INFLUENZA A ANTIGEN, POC: NEGATIVE
INFLUENZA B ANTIGEN, POC: NEGATIVE

## 2021-01-03 MED ORDER — ACETAMINOPHEN 500 MG PO TABS
1000.0000 mg | ORAL_TABLET | Freq: Once | ORAL | Status: AC
  Administered 2021-01-03: 1000 mg via ORAL

## 2021-01-03 NOTE — ED Triage Notes (Signed)
Pt c/o sinus congestion since yesterday, slight cough and sore throat. Pt also has some body aches. Pt denies f/n/v/d or other symptoms.

## 2021-01-03 NOTE — Discharge Instructions (Addendum)
As we discussed, your flu test was negative. Your COVID test was sent to the hospital and someone will contact you with a positive result. Have asked you to go ahead and download the app MyChart on your phone you can follow along with your results. Please see educational handouts on COVID.  You should isolate until you know your test results.  If they are positive you will need to quarantine.  If you are positive someone will give you an updated work note. 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 Plenty of rest, plenty fluids, Tylenol or Motrin for any fever or discomfort. Since you do not have any major medical issues and do not take medicines on a regular basis unless you become really sick I do not believe you would be a good candidate for the new antiviral medications as they are not without side effects. If your symptoms persist please see your primary care provider. If they worsen please go to the ER.

## 2021-01-04 LAB — SARS CORONAVIRUS 2 (TAT 6-24 HRS): SARS Coronavirus 2: POSITIVE — AB

## 2021-01-04 NOTE — ED Provider Notes (Signed)
[facility]    CSN: [id number] Arrival date & time: 01/03/21  1843      History   Chief Complaint Chief Complaint  Patient presents with   Nasal Congestion    HPI Nathaniel Shaw is a 65 y.o. male.   65 year old male who presents for evaluation of the above issues.  Normally goes to [facility] for his ongoing medical needs.  He works as a Designer, television/film set.  He reports his symptoms began yesterday.  He reports a fever, body aches, myalgia, mild cough, mild sore throat, nasal congestion and postnasal drip.  He denies any nausea vomiting or diarrhea.  No urinary or abdominal symptoms.  No chest pain or shortness of breath.  He has not been exposed to [place].  He has been vaccinated x2 but no booster.  He has received his flu shot.  He reports he has no medical conditions and takes no medicines on a regular basis.  He did go into work this morning and was basically unable to complete his shift and left at lunchtime he came to the urgent care.  No red flag signs or symptoms were elicited on history.   Past Medical History:  Diagnosis Date   OSA on CPAP    Restless leg syndrome     Patient Active Problem List   Diagnosis Date Noted   CPAP use counseling 05/09/2020   OSA on CPAP    Restless leg syndrome     Past Surgical History:  Procedure Laterality Date   KNEE ARTHROSCOPY         Home Medications    Prior to Admission medications   Medication Sig Start Date End Date Taking? Authorizing Provider  fluticasone (FLONASE) 50 MCG/ACT nasal spray Place 1 spray into both nostrils daily. 11/04/20  Yes Verda Cumins, MD  cetirizine-pseudoephedrine (ZYRTEC-D) 5-120 MG tablet Take 1 tablet by mouth daily. 11/04/20   Verda Cumins, MD  azelastine (ASTELIN) 0.1 % nasal spray Place into the nose. 06/28/17 11/04/20  [provider]    Family History Family History  Problem Relation Age of Onset   Cancer Mother    Healthy Father     Social  History Social History   Tobacco Use   Smoking status: Never   Smokeless tobacco: Never  Vaping Use   Vaping Use: Never used  Substance Use Topics   Alcohol use: Yes    Comment: occasional   Drug use: Never     Allergies   Patient has no known allergies.   Review of Systems Review of Systems  Constitutional:  Positive for fever. Negative for activity change, appetite change and fatigue.  HENT:  Positive for congestion, postnasal drip and sore throat. Negative for ear pain, rhinorrhea, sinus pressure, sinus pain and sneezing.   Eyes:  Negative for pain.  Respiratory:  Positive for cough. Negative for chest tightness, shortness of breath and wheezing.   Cardiovascular:  Negative for chest pain and palpitations.  Gastrointestinal:  Negative for abdominal pain, diarrhea, nausea and vomiting.  Genitourinary:  Negative for dysuria.  Musculoskeletal:  Negative for back pain, myalgias and neck pain.  Skin:  Negative for color change, pallor, rash and wound.  Neurological:  Negative for dizziness, light-headedness, numbness and headaches.  All other systems reviewed and are negative.   Physical Exam Triage Vital Signs ED Triage Vitals  Enc Vitals Group     BP 01/03/21 1900 (!) 164/82     Pulse Rate 01/03/21 1900 92  Resp 01/03/21 1900 18     Temp 01/03/21 1900 (!) 102 F (38.9 C)     Temp Source 01/03/21 1900 Oral     SpO2 01/03/21 1900 98 %     Weight 01/03/21 1858 205 lb (93 kg)     Height 01/03/21 1858 5\' 9"  (1.753 m)     Head Circumference --      Peak Flow --      Pain Score 01/03/21 1857 0     Pain Loc --      Pain Edu? --      Excl. in [place]? --    No data found.  Updated Vital Signs BP (!) 164/82 (BP Location: Left Arm)   Pulse 92   Temp (!) 102 F (38.9 C) (Oral)   Resp 18   Ht 5\' 9"  (1.753 m)   Wt 93 kg   SpO2 98%   BMI 30.27 kg/m   Visual Acuity Right Eye Distance:   Left Eye Distance:   Bilateral Distance:    Right Eye Near:   Left Eye  Near:    Bilateral Near:     Physical Exam Vitals and nursing note reviewed.  Constitutional:      General: He is not in acute distress.    Appearance: Normal appearance. He is ill-appearing. He is not toxic-appearing or diaphoretic.  HENT:     Head: Normocephalic and atraumatic.     Nose: Congestion present. No rhinorrhea.     Mouth/Throat:     Mouth: Mucous membranes are moist.     Pharynx: Posterior oropharyngeal erythema present. No oropharyngeal exudate.  Eyes:     General: No scleral icterus.       Right eye: No discharge.        Left eye: No discharge.     Extraocular Movements: Extraocular movements intact.     Conjunctiva/sclera: Conjunctivae normal.     Pupils: Pupils are equal, round, and reactive to light.  Cardiovascular:     Rate and Rhythm: Normal rate and regular rhythm.     Pulses: Normal pulses.     Heart sounds: Normal heart sounds. No murmur heard.   No friction rub. No gallop.  Pulmonary:     Effort: Pulmonary effort is normal.     Breath sounds: Normal breath sounds. No stridor. No wheezing, rhonchi or rales.  Musculoskeletal:     Cervical back: Normal range of motion and neck supple. No rigidity or tenderness.  Lymphadenopathy:     Cervical: Cervical adenopathy present.  Skin:    General: Skin is warm and dry.     Capillary Refill: Capillary refill takes less than 2 seconds.     Coloration: Skin is not jaundiced.     Findings: No erythema, lesion or rash.  Neurological:     General: No focal deficit present.     Mental Status: He is alert and oriented to person, place, and time.     UC Treatments / Results  Labs (all labs ordered are listed, but only abnormal results are displayed) Labs Reviewed  SARS CORONAVIRUS 2 (TAT 6-24 HRS) - Abnormal; Notable for the following components:      Result Value   SARS Coronavirus 2 POSITIVE (*)    All other components within normal limits  INFLUENZA A AND B ANTIGEN (CONVERTED LAB)  POC INFLUENZA A AND B  ANTIGEN (URGENT CARE ONLY)    EKG   Radiology No results found.  Procedures Procedures (including critical care time)  Medications Ordered in UC Medications  acetaminophen (TYLENOL) tablet 1,000 mg (1,000 mg Oral Given 01/03/21 1916)    Initial Impression / Assessment and Plan / UC Course  I have reviewed the triage vital signs and the nursing notes.  Pertinent labs & imaging results that were available during my care of the patient were reviewed by me and considered in my medical decision making (see chart for details).  Clinical impression: 2 days of URI symptoms including fever, myalgias, mild cough, mild sore throat, nasal congestion and postnasal drip.  Treatment plan: 1.  The findings and treatment plan were discussed in detail with the patient.  Patient was in agreement. 2.  His temperature is 102 on arrival.  Acetaminophen, 1 g was given and he tolerated this well. 3.  Given it was after hours we could only run a rapid influenza test in the office that was negative for influenza a and B.  We will go ahead and send off a COVID test to the hospital.  I suspect he is going to be positive. 4.  Provided him a work note and I want him to isolate until he has his results back.  If he is positive he will need to quarantine for at least 5 days per current CDC guidelines. 5.  Given that he does not have any major medical problems and other than the fever and some mild URI symptoms we will hold on any anti-viral COVID medication at the present time. 6.  Plenty of rest, plenty of fluids, Tylenol or Motrin for any fever or discomfort. 7.  Educational handouts provided. 8.  If symptoms persist then he should see his PCP, if they worsen in any way he should go to the ER. 9.  We will discharge him from care and he will follow-up with [place] as needed.    Final Clinical Impressions(s) / UC Diagnoses   Final diagnoses:  Febrile illness, acute  Myalgia  Body aches  Nasal congestion  Cough   Sore throat (viral)  Viral illness     Discharge Instructions      As we discussed, your flu test was negative. Your COVID test was sent to the hospital and someone will contact you with a positive result. Have asked you to go ahead and download the app MyChart on your phone you can follow along with your results. Please see educational handouts on COVID.  You should isolate until you know your test results.  If they are positive you will need to quarantine.  If you are positive someone will give you an updated work note. 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 Plenty of rest, plenty fluids, Tylenol or Motrin for any fever or discomfort. Since you do not have any major medical issues and do not take medicines on a regular basis unless you become really sick I do not believe you would be a good candidate for the new antiviral medications as they are not without side effects. If your symptoms persist please see your primary care provider. If they worsen please go to the ER.     ED Prescriptions   None    PDMP not reviewed this encounter.   Verda Cumins, MD 01/04/21 [phone]

## 2021-03-22 ENCOUNTER — Other Ambulatory Visit: Payer: Self-pay

## 2021-03-22 ENCOUNTER — Ambulatory Visit
Admission: EM | Admit: 2021-03-22 | Discharge: 2021-03-22 | Disposition: A | Discharge status: Home / Self Care | Payer: Medicare Other | Attending: Emergency Medicine | Admitting: Emergency Medicine

## 2021-03-22 DIAGNOSIS — H6503 Acute serous otitis media, bilateral: Secondary | ICD-10-CM

## 2021-03-22 MED ORDER — MECLIZINE HCL 25 MG PO TABS: 25.0000 mg | ORAL_TABLET | Freq: Three times a day (TID) | ORAL | 0 refills | Status: DC | PRN

## 2021-03-22 MED ORDER — AMOXICILLIN-POT CLAVULANATE 875-125 MG PO TABS: 1.0000 | ORAL_TABLET | Freq: Two times a day (BID) | ORAL | 0 refills | Status: AC

## 2021-03-22 NOTE — Discharge Instructions (Addendum)
Take the Augmentin twice daily for 10 days with food for treatment of your ear infection.  Take an over-the-counter probiotic 1 hour after each dose of antibiotic to prevent diarrhea.  Use over-the-counter Tylenol and ibuprofen as needed for pain or fever.  Take the Meclizine every 8 hours as needed for dizziness.   Place a hot water bottle, or heating pad, underneath your pillowcase at night to help dilate up your ear and aid in pain relief as well as resolution of the infection.  Return for reevaluation for any new or worsening symptoms.

## 2021-03-22 NOTE — ED Triage Notes (Signed)
Pt reports having 3 episodes of emesis this morning. Also reports feeling lightheaded.

## 2021-03-22 NOTE — ED Provider Notes (Signed)
[facility]    CSN: [id number] Arrival date & time: 03/22/21  1018      History   Chief Complaint Chief Complaint  Patient presents with   Emesis    HPI Nathaniel Shaw is a 65 y.o. male.   HPI  65 year old male here for evaluation of vomiting and lightheadedness.  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  He has had 3 episodes of vomiting all for the coffee that he ate for breakfast.  The vomiting has resolved.  He is having some intermittent dizziness and room spinning.  He does have a history of vertigo.  Patient also endorses nasal congestion and tinnitus in both ears.  He has not had a fever, headache, changes to his vision, nasal discharge, or ear pain.  Past Medical History:  Diagnosis Date   OSA on CPAP    Restless leg syndrome     Patient Active Problem List   Diagnosis Date Noted   CPAP use counseling 05/09/2020   OSA on CPAP    Restless leg syndrome     Past Surgical History:  Procedure Laterality Date   KNEE ARTHROSCOPY         Home Medications    Prior to Admission medications   Medication Sig Start Date End Date Taking? Authorizing Provider  amoxicillin-clavulanate (AUGMENTIN) 875-125 MG tablet Take 1 tablet by mouth every 12 (twelve) hours for 10 days. 03/22/21 04/01/21 Yes Margarette Canada, NP  meclizine (ANTIVERT) 25 MG tablet Take 1 tablet (25 mg total) by mouth 3 (three) times daily as needed for dizziness. 03/22/21  Yes Margarette Canada, NP  cetirizine-pseudoephedrine (ZYRTEC-D) 5-120 MG tablet Take 1 tablet by mouth daily. 11/04/20   Verda Cumins, MD  fluticasone [facility]) 50 MCG/ACT nasal spray Place 1 spray into both nostrils daily. 11/04/20   Verda Cumins, MD  azelastine (ASTELIN) 0.1 % nasal spray Place into the nose. 06/28/17 11/04/20  [provider]    Family History Family History  Problem Relation Age of Onset   Cancer Mother    Healthy Father     Social History Social History   Tobacco Use   Smoking status: Never   Smokeless tobacco: Never  Vaping Use   Vaping Use: Never used  Substance Use Topics   Alcohol use: Yes    Comment: occasional   Drug use: Never     Allergies   Patient has no known allergies.   Review of Systems Review of Systems  Constitutional:  Negative for activity change, appetite change and fever.  HENT:  Positive for congestion and tinnitus. Negative for rhinorrhea and sore throat.   Eyes:  Negative for visual disturbance.  Gastrointestinal:  Positive for nausea and vomiting. Negative for anal bleeding and diarrhea.  Neurological:  Positive for dizziness. Negative for headaches.  Hematological: Negative.   Psychiatric/Behavioral: Negative.      Physical Exam Triage Vital Signs ED Triage Vitals  Enc Vitals Group     BP 03/22/21 1053 140/71     Pulse Rate 03/22/21 1053 61     Resp 03/22/21 1053 18     Temp 03/22/21 1053 98.2 F (36.8 C)  Temp Source 03/22/21 1053 Oral     SpO2 03/22/21 1053 99 %     Weight 03/22/21 1054 205 lb (93 kg)     Height 03/22/21 1054 '5\' 9"'$  (1.753 m)     Head Circumference --      Peak Flow --      Pain Score 03/22/21 1054 0     Pain Loc --      Pain Edu? --      Excl. in [place]? --    No data found.  Updated Vital Signs BP 140/71   Pulse 61   Temp 98.2 F (36.8 C) (Oral)   Resp 18   Ht '5\' 9"'$  (1.753 m)   Wt 205 lb (93 kg)   SpO2 99%   BMI 30.27 kg/m   Visual Acuity Right Eye Distance:   Left Eye Distance:   Bilateral Distance:    Right Eye Near:   Left Eye Near:    Bilateral Near:     Physical Exam Vitals and nursing note reviewed.  Constitutional:      General: He is not in acute distress.    Appearance: Normal  appearance. He is normal weight. He is not ill-appearing.  HENT:     Head: Normocephalic and atraumatic.     Right Ear: Ear canal and external ear normal. There is no impacted cerumen.     Left Ear: Ear canal and external ear normal. There is no impacted cerumen.     Nose: Congestion present. No rhinorrhea.     Mouth/Throat:     Mouth: Mucous membranes are moist.     Pharynx: No posterior oropharyngeal erythema.  Cardiovascular:     Rate and Rhythm: Normal rate and regular rhythm.     Pulses: Normal pulses.     Heart sounds: Normal heart sounds. No murmur heard.   No gallop.  Pulmonary:     Effort: Pulmonary effort is normal.     Breath sounds: Normal breath sounds. No wheezing, rhonchi or rales.  Musculoskeletal:     Cervical back: Normal range of motion and neck supple.  Lymphadenopathy:     Cervical: No cervical adenopathy.  Skin:    General: Skin is warm and dry.     Capillary Refill: Capillary refill takes less than 2 seconds.     Findings: No erythema or rash.  Neurological:     General: No focal deficit present.     Mental Status: He is alert and oriented to person, place, and time.  Psychiatric:        Mood and Affect: Mood normal.        Behavior: Behavior normal.        Thought Content: Thought content normal.        Judgment: Judgment normal.     UC Treatments / Results  Labs (all labs ordered are listed, but only abnormal results are displayed) Labs Reviewed - No data to display  EKG   Radiology No results found.  Procedures Procedures (including critical care time)  Medications Ordered in UC Medications - No data to display  Initial Impression / Assessment and Plan / UC Course  I have reviewed the triage vital signs and the nursing notes.  Pertinent labs & imaging results that were available during my care of the patient were reviewed by me and considered in my medical decision making (see chart for details).  Patient is a nontoxic-appearing  65 year old male here for evaluation of room spinning, lightheadedness, and emesis  x3 that all started this morning.  Patient does have a history of vertigo and states that this feels similar.  He states that he had mild room spinning possibly before he got up but had a normal routine this morning.  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  This caused the room to spin and nausea and vomiting to occur.  He had 3 episodes of emesis which has since resolved.  He still endorses some mild lightheadedness but has not had any room spinning while here in the exam room.  Patient's physical exam reveals erythematous tympanic membranes bilaterally with serous effusions.  External auditory canals are unremarkable.  Nasal mucosa is erythematous and edematous without discharge.  Oropharyngeal exam is benign.  No cervical lymphadenopathy appreciated exam.  Cardiopulmonary exam reveals clear lung sounds.  Suspect patient's symptoms are coming from bilateral serous otitis media.  We will treat with Antivert to help with the dizziness symptoms and both effusions are milky in appearance so we will treat for with Augmentin for his bilateral ear infections twice daily for 10 days.  Patient advised to return for new or worsening symptoms or go to the ER.   Final Clinical Impressions(s) / UC Diagnoses   Final diagnoses:  Non-recurrent acute serous otitis media of both ears     Discharge Instructions      Take the Augmentin twice daily for 10 days with food for treatment of your ear infection.  Take an over-the-counter probiotic 1 hour after each dose of antibiotic to prevent diarrhea.  Use over-the-counter Tylenol and ibuprofen as needed for pain or fever.  Take the Meclizine every 8 hours as needed  for dizziness.   Place a hot water bottle, or heating pad, underneath your pillowcase at night to help dilate up your ear and aid in pain relief as well as resolution of the infection.  Return for reevaluation for any new or worsening symptoms.      ED Prescriptions     Medication Sig Dispense Auth. Provider   amoxicillin-clavulanate (AUGMENTIN) 875-125 MG tablet Take 1 tablet by mouth every 12 (twelve) hours for 10 days. 20 tablet Margarette Canada, NP   meclizine (ANTIVERT) 25 MG tablet Take 1 tablet (25 mg total) by mouth 3 (three) times daily as needed for dizziness. 30 tablet Margarette Canada, NP      PDMP not reviewed this encounter.   Margarette Canada, NP 03/22/21 1118

## 2021-05-15 ENCOUNTER — Ambulatory Visit (INDEPENDENT_AMBULATORY_CARE_PROVIDER_SITE_OTHER): Payer: Medicare Other | Admitting: Internal Medicine

## 2021-05-15 ENCOUNTER — Other Ambulatory Visit: Payer: Self-pay

## 2021-05-15 DIAGNOSIS — G4733 Obstructive sleep apnea (adult) (pediatric): Secondary | ICD-10-CM | POA: Diagnosis not present

## 2021-05-15 DIAGNOSIS — Z7189 Other specified counseling: Secondary | ICD-10-CM | POA: Diagnosis not present

## 2021-05-15 DIAGNOSIS — Z9989 Dependence on other enabling machines and devices: Secondary | ICD-10-CM

## 2021-05-15 NOTE — Progress Notes (Signed)
[facility] [place], [place] [address]  Pulmonary Sleep Medicine   Office Visit Note  Patient Name: Nathaniel Shaw DOB: [birth year] MRN [id number]    Chief Complaint: Obstructive Sleep Apnea visit  Brief History:  Nathaniel Shaw is seen today for one year follow up The patient has a 7 years history of sleep apnea. Patient is using PAP nightly.  The patient feels great after sleeping with PAP.  The patient reports benefiting from PAP use. Reported sleepiness is  much better and the Epworth Sleepiness Score is 7 out of 24. The patient does on Sundays approx hour, but without cpap.   take naps. The patient complains of the following: sometimes wakes sometimes in the middle of the night for no reason, but takes long time to return to sleep and machine is over 64 years old-gets message max hours exceeded.  The compliance download shows  compliance with an average use time of 6:57 hours @ 98%. The AHI is 1.4  The patient does not complain of limb movements disrupting sleep.  ROS  General: (-) fever, (-) chills, (-) night sweat Nose and Sinuses: (-) nasal stuffiness or itchiness, (-) postnasal drip, (-) nosebleeds, (-) sinus trouble. Mouth and Throat: (-) sore throat, (-) hoarseness. Neck: (-) swollen glands, (-) enlarged thyroid, (-) neck pain. Respiratory: - cough, - shortness of breath, - wheezing. Neurologic: - numbness, - tingling. Psychiatric: - anxiety, - depression   Current Medication: Outpatient Encounter Medications as of 05/15/2021  Medication Sig   cetirizine-pseudoephedrine (ZYRTEC-D) 5-120 MG tablet Take 1 tablet by mouth daily.   fluticasone (FLONASE) 50 MCG/ACT nasal spray Place 1 spray into both nostrils daily.   meclizine (ANTIVERT) 25 MG tablet Take 1 tablet (25 mg total) by mouth 3 (three) times daily as needed for dizziness.   [DISCONTINUED] azelastine (ASTELIN) 0.1 % nasal spray Place into the nose.   No facility-administered encounter  medications on file as of 05/15/2021.    Surgical History: Past Surgical History:  Procedure Laterality Date   KNEE ARTHROSCOPY      Medical History: Past Medical History:  Diagnosis Date   OSA on CPAP    Restless leg syndrome     Family History: Non contributory to the present illness  Social History: Social History   Socioeconomic History   Marital status: Married    Spouse name: Not on file   Number of children: Not on file   Years of education: Not on file   Highest education level: Not on file  Occupational History   Not on file  Tobacco Use   Smoking status: Never   Smokeless tobacco: Never  Vaping Use   Vaping Use: Never used  Substance and Sexual Activity   Alcohol use: Yes    Comment: occasional   Drug use: Never   Sexual activity: Not on file  Other Topics Concern   Not on file  Social History Narrative   Not on file   Social Determinants of Health   Financial Resource Strain: Not on file  Food Insecurity: Not on file  Transportation Needs: Not on file  Physical Activity: Not on file  Stress: Not on file  Social Connections: Not on file  Intimate Partner Violence: Not on file    Vital Signs: There were no vitals taken for this visit.  Examination: General Appearance: The patient is well-developed, well-nourished, and in no distress. Neck Circumference: 42 cm Skin: Gross inspection of skin unremarkable. Head: normocephalic, no gross deformities. Eyes:  no gross deformities noted. ENT: ears appear grossly normal Neurologic: Alert and oriented. No involuntary movements.    EPWORTH SLEEPINESS SCALE:  Scale:  (0)= no chance of dozing; (1)= slight chance of dozing; (2)= moderate chance of dozing; (3)= high chance of dozing  Chance  Situtation    Sitting and reading: 2    Watching TV: 1    Sitting Inactive in public: 1    As a passenger in car: 1      Lying down to rest: 2    Sitting and talking: 0    Sitting quielty after  lunch: 0    In a car, stopped in traffic: 0   TOTAL SCORE:   7 out of 24    SLEEP STUDIES:  PSG 10/2013 AHI 17 SpO70min 83%   CPAP COMPLIANCE DATA:  Date Range: 05/15/20 - 05/14/21  Average Daily Use: 6:57 hours  Median Use: 7:05%  Compliance for > 4 Hours: 98% days  AHI: 1.4 respiratory events per hour  Days Used: 358/365  Mask Leak: 11.2  95th Percentile Pressure: 10 cmH2O         LABS: No results found for this or any previous visit (from the past 2160 hour(s)).  Radiology: No results found.  No results found.  No results found.    Assessment and Plan: Patient Active Problem List   Diagnosis Date Noted   CPAP use counseling 05/09/2020   OSA on CPAP    Restless leg syndrome     1. OSA on CPAP The patient does tolerate PAP and reports  benefit from PAP use.  Machine has reached end of life and must be replaced. The patient was reminded how to clean equipment and advised to replace supplies routinely. The patient was also counselled on weight loss. The compliance is excellent . The AHI is 1.4 .   OSA- continue with excellent compliance with pap. Replace machine. F/u 30d after setup.    2. CPAP use counseling CPAP Counseling: had a lengthy discussion with the patient regarding the importance of PAP therapy in management of the sleep apnea. Patient appears to understand the risk factor reduction and also understands the risks associated with untreated sleep apnea. Patient will try to make a good faith effort to remain compliant with therapy. Also instructed the patient on proper cleaning of the device including the water must be changed daily if possible and use of distilled water is preferred. Patient understands that the machine should be regularly cleaned with appropriate recommended cleaning solutions that do not damage the PAP machine for example given white vinegar and water rinses. Other methods such as ozone treatment may not be as good as these  simple methods to achieve cleaning.    .   General Counseling: I have discussed the findings of the evaluation and examination with Nathaniel Shaw.  I have also discussed any further diagnostic evaluation thatmay be needed or ordered today. Nathaniel Shaw verbalizes understanding of the findings of todays visit. We also reviewed his medications today and discussed drug interactions and side effects including but not limited excessive drowsiness and altered mental states. We also discussed that there is always a risk not just to him but also people around him. he has been encouraged to call the office with any questions or concerns that should arise related to todays visit.  No orders of the defined types were placed in this encounter.       I have personally obtained a history, examined the patient, evaluated  laboratory and imaging results, formulated the assessment and plan and placed orders. This patient was seen today by Tressie Ellis, PA-C in collaboration with Dr. Devona Konig.   Allyne Gee, MD [facility] Diplomate ABMS Pulmonary Critical Care Medicine and Sleep Medicine

## 2021-05-15 NOTE — Patient Instructions (Signed)

## 2021-08-14 ENCOUNTER — Ambulatory Visit
Admission: EM | Admit: 2021-08-14 | Discharge: 2021-08-14 | Disposition: A | Discharge status: Home / Self Care | Payer: Medicare Other | Attending: Emergency Medicine | Admitting: Emergency Medicine

## 2021-08-14 ENCOUNTER — Other Ambulatory Visit: Payer: Self-pay

## 2021-08-14 DIAGNOSIS — S61412A Laceration without foreign body of left hand, initial encounter: Secondary | ICD-10-CM

## 2021-08-14 MED ORDER — CEPHALEXIN 500 MG PO CAPS: 500.0000 mg | ORAL_CAPSULE | Freq: Three times a day (TID) | ORAL | 0 refills | Status: DC

## 2021-08-14 NOTE — ED Provider Notes (Signed)
[facility]    CSN: [id number] Arrival date & time: 08/14/21  1210      History   Chief Complaint Chief Complaint  Patient presents with   Laceration    Top left hand    HPI Nathaniel Shaw is a 66 y.o. male.   HPI  66 year old male here for evaluation of hand laceration.  Patient ports that he was working on a bracket when his wrench slipped causing his left hand impact the bracket he was working on and he sustained a laceration to the back of his left hand.  The back he was working on was covered in grease and he used either to remove the grease at his place of employment.  He states that it did not bleed significantly at the time of injury and there is no active bleeding now.  No numbness or tingling in the fingers either.  Past Medical History:  Diagnosis Date   OSA on CPAP    Restless leg syndrome     Patient Active Problem List   Diagnosis Date Noted   CPAP use counseling 05/09/2020   OSA on CPAP    Restless leg syndrome     Past Surgical History:  Procedure Laterality Date   KNEE ARTHROSCOPY         Home Medications    Prior to Admission medications   Medication Sig Start Date End Date Taking? Authorizing Provider  cephALEXin (KEFLEX) 500 MG capsule Take 1 capsule (500 mg total) by mouth 3 (three) times daily for 5 days. 08/14/21 08/19/21 Yes Margarette Canada, NP  azelastine (ASTELIN) 0.1 % nasal spray Place into the nose. 06/28/17 11/04/20  [provider]    Family History Family History  Problem Relation Age of Onset   Cancer Mother    Healthy Father     Social History Social History   Tobacco Use   Smoking status: Never   Smokeless tobacco: Never  Vaping Use   Vaping Use: Never used  Substance Use Topics   Alcohol use: Yes    Comment: occasional   Drug use: Never     Allergies   Patient has no known allergies.   Review of Systems Review of Systems  Constitutional:  Negative for fatigue.  Musculoskeletal:   Negative for myalgias.  Skin:  Positive for wound. Negative for color change.  Neurological:  Negative for weakness and numbness.  Hematological: Negative.   Psychiatric/Behavioral: Negative.      Physical Exam Triage Vital Signs ED Triage Vitals [08/14/21 1226]  Enc Vitals Group     BP (!) 150/87     Pulse Rate 75     Resp 18     Temp 98.7 F (37.1 C)     Temp Source Oral     SpO2 100 %     Weight      Height      Head Circumference      Peak Flow      Pain Score      Pain Loc      Pain Edu?      Excl. in [place]?    No data found.  Updated Vital Signs BP (!) 150/87 (BP Location: Left Arm)    Pulse 75    Temp 98.7 F (37.1 C) (Oral)    Resp 18    SpO2 100%   Visual Acuity Right Eye Distance:   Left Eye Distance:   Bilateral Distance:    Right Eye Near:  Left Eye Near:    Bilateral Near:     Physical Exam Vitals and nursing note reviewed.  Constitutional:      General: He is not in acute distress.    Appearance: Normal appearance. He is not ill-appearing.  HENT:     Head: Normocephalic and atraumatic.  Musculoskeletal:        General: Signs of injury present. No swelling, tenderness or deformity.  Skin:    General: Skin is warm and dry.     Capillary Refill: Capillary refill takes less than 2 seconds.     Findings: No bruising or erythema.  Neurological:     General: No focal deficit present.     Mental Status: He is alert and oriented to person, place, and time.     Sensory: No sensory deficit.     Motor: No weakness.  Psychiatric:        Mood and Affect: Mood normal.        Behavior: Behavior normal.        Thought Content: Thought content normal.        Judgment: Judgment normal.     UC Treatments / Results  Labs (all labs ordered are listed, but only abnormal results are displayed) Labs Reviewed - No data to display  EKG   Radiology No results found.  Procedures Laceration Repair  Date/Time: 08/14/2021 1:03 PM Performed by: Margarette Canada, NP Authorized by: Margarette Canada, NP   Consent:    Consent obtained:  Verbal   Consent given by:  Patient   Risks discussed:  Poor cosmetic result and poor wound healing   Alternatives discussed:  No treatment Universal protocol:    Procedure explained and questions answered to patient or proxy's satisfaction: yes     Patient identity confirmed:  Verbally with patient Anesthesia:    Anesthesia method:  None Laceration details:    Location:  Hand   Hand location:  L hand, dorsum   Length (cm):  3   Depth (mm):  2 Exploration:    Hemostasis achieved with:  Direct pressure   Wound extent: areolar tissue violated     Wound extent: no fascia violation noted   Treatment:    Area cleansed with:  Saline   Amount of cleaning:  Standard   Visualized foreign bodies/material removed: yes     Debridement:  None   Undermining:  None Skin repair:    Repair method:  Tissue adhesive Approximation:    Approximation:  Close Repair type:    Repair type:  Simple Post-procedure details:    Dressing:  Open (no dressing) (including critical care time)  Medications Ordered in UC Medications - No data to display  Initial Impression / Assessment and Plan / UC Course  I have reviewed the triage vital signs and the nursing notes.  Pertinent labs & imaging results that were available during my care of the patient were reviewed by me and considered in my medical decision making (see chart for details).  Patient is a very pleasant, nontoxic-appearing 66 year old male here for evaluation of a laceration to the back of his left hand that he sustained earlier today.  He was working on a bracket of some sort when the wrench slipped causing his hand to impact the bracket and sustained a laceration.  On exam patient has a 3 cm linear laceration to the posterior medial aspect of the left hand.  There is no active bleeding from the site.  It is essentially a  skin tear with a small avulsed flap of skin.   The wound bed is red without any active bleeding.  There are some flecks of grease in the wound which I was able to easily removed using a cotton-tipped applicator and saline.  The skin flap was then secured using Dermabond.  I will discharge patient home on Keflex for prophylactic antibiotics.  His last tetanus shot was in 2020 so he is up-to-date.   Final Clinical Impressions(s) / UC Diagnoses   Final diagnoses:  Laceration of left hand without foreign body, initial encounter     Discharge Instructions      Keep the area clean and dry for the next 48 hours.  After that you may wash your hands gently.  Take the Keflex 3 times a day for 5 days for prevention of wound infection.  The tissue adhesive will dissolve on its own  If you develop any redness at the wound site, swelling, pain, drainage, red streaks going up your hand, or fever please return for reevaluation.      ED Prescriptions     Medication Sig Dispense Auth. Provider   cephALEXin (KEFLEX) 500 MG capsule Take 1 capsule (500 mg total) by mouth 3 (three) times daily for 5 days. 20 capsule Margarette Canada, NP      PDMP not reviewed this encounter.   Margarette Canada, NP 08/14/21 1306

## 2021-08-14 NOTE — Discharge Instructions (Addendum)
Keep the area clean and dry for the next 48 hours.  After that you may wash your hands gently.  Take the Keflex 3 times a day for 5 days for prevention of wound infection.  The tissue adhesive will dissolve on its own  If you develop any redness at the wound site, swelling, pain, drainage, red streaks going up your hand, or fever please return for reevaluation.

## 2021-08-14 NOTE — ED Triage Notes (Signed)
Pt here with C/O left hand laceration. Stated had a wrench slip and cut top of left hand. TDAP:07/14/2018

## 2021-08-16 ENCOUNTER — Ambulatory Visit
Admission: EM | Admit: 2021-08-16 | Discharge: 2021-08-16 | Disposition: A | Discharge status: Home / Self Care | Payer: Medicare Other

## 2021-08-16 ENCOUNTER — Other Ambulatory Visit: Payer: Self-pay

## 2021-08-16 DIAGNOSIS — Z5189 Encounter for other specified aftercare: Secondary | ICD-10-CM

## 2021-08-16 DIAGNOSIS — S61412D Laceration without foreign body of left hand, subsequent encounter: Secondary | ICD-10-CM

## 2021-08-16 NOTE — ED Notes (Signed)
Provider at bedside to assessed wound.

## 2021-08-16 NOTE — ED Triage Notes (Signed)
Patient presents to Urgent Care for nurse visit. Pt had a laceration repair on left hand 02/06. Pt was advised to secure area with a nonadherent dressing for 48 hrs. He believes he applied the wrong dressing and that it may be stuck to skin. Pt here for eval.

## 2021-08-19 ENCOUNTER — Encounter: Payer: Self-pay | Admitting: Gynecology

## 2021-08-19 ENCOUNTER — Ambulatory Visit
Admission: EM | Admit: 2021-08-19 | Discharge: 2021-08-19 | Disposition: A | Discharge status: Home / Self Care | Payer: Medicare Other | Attending: Emergency Medicine | Admitting: Emergency Medicine

## 2021-08-19 ENCOUNTER — Other Ambulatory Visit: Payer: Self-pay

## 2021-08-19 DIAGNOSIS — Z5189 Encounter for other specified aftercare: Secondary | ICD-10-CM

## 2021-08-19 DIAGNOSIS — S61412D Laceration without foreign body of left hand, subsequent encounter: Secondary | ICD-10-CM

## 2021-08-19 NOTE — Discharge Instructions (Addendum)
Keep this clean and dry for the next 3 to 4 days.  You can take the bandage off periodically and give it some extra drying time, but I would keep it covered until this is healed.  It will probably take 10 days to completely heal.  This looks good- no evidence of infection.  You can try Emergen-C vitamin C powder packets, take 1 in the morning.  This will help it heal.

## 2021-08-19 NOTE — ED Triage Notes (Signed)
Wound check, left hand.

## 2021-08-19 NOTE — ED Provider Notes (Signed)
HPI  SUBJECTIVE:  Nathaniel Shaw is a 66 y.o. male who presents for wound check.  Patient originally seen here on 2/6 for 3 cm left dorsal hand laceration which was closed with tissue adhesive and sent home with keflex.  Patient states that he is taking the Keflex.  He reports some serosanguineous drainage.  No fevers, worsening erythema, hand swelling.  He put an occlusive bandage on it for 2 days, which was taken off on his last visit on 2/8.  He would like to know if everything is okay and if he can get it wet.  Past Medical History:  Diagnosis Date   OSA on CPAP    Restless leg syndrome     Past Surgical History:  Procedure Laterality Date   KNEE ARTHROSCOPY      Family History  Problem Relation Age of Onset   Cancer Mother    Healthy Father     Social History   Tobacco Use   Smoking status: Never   Smokeless tobacco: Never  Vaping Use   Vaping Use: Never used  Substance Use Topics   Alcohol use: Yes    Comment: occasional   Drug use: Never    No current facility-administered medications for this encounter. No current outpatient medications on file.  No Known Allergies   ROS  As noted in HPI.   Physical Exam  BP (!) 164/77 (BP Location: Left Arm)    Pulse 89    Temp 98.5 F (36.9 C) (Oral)    Wt 93 kg    SpO2 99%    BMI 30.27 kg/m   Constitutional: Well developed, well nourished, no acute distress Eyes:  EOMI, conjunctiva normal bilaterally HENT: Normocephalic, atraumatic,mucus membranes moist Respiratory: Normal inspiratory effort Cardiovascular: Normal rate GI: nondistended skin: 3 cm skin tear dorsum left hand   Post glue removal       Musculoskeletal: no deformities Neurologic: Alert & oriented x 3, no focal neuro deficits Psychiatric: Speech and behavior appropriate   ED Course   Medications - No data to display  Orders Placed This Encounter  Procedures   Apply dressing    Tesla nonstick and Kerlix wrapping    Standing  Status:   Standing    Number of Occurrences:   1    No results found for this or any previous visit (from the past 24 hour(s)). No results found.  ED Clinical Impression  1. Laceration of left hand without foreign body, subsequent encounter   2. Visit for wound check      ED Assessment/Plan  Previous records reviewed.  As noted in HPI.  Wound appears to be healing nicely.  There is no evidence of infection.  Removed the glue.  Will apply a nonstick dressing, gauze wrapping so that it can dry.  Patient to continue keeping this dry for the next several days, and that it may take up to 10 days for it after initial injury to completely heal.  Advised vitamin C.   No orders of the defined types were placed in this encounter.     *This clinic note was created using Dragon dictation software. Therefore, there may be occasional mistakes despite careful proofreading.  ?    Melynda Ripple, MD 08/19/21 [phone]

## 2021-08-23 NOTE — ED Provider Notes (Signed)
[facility]    CSN: [id number] Arrival date & time: 08/16/21  1412      History   Chief Complaint Chief Complaint  Patient presents with   Wound Check   Nurse Visit    HPI Nathaniel Shaw is a 66 y.o. male.   Patient presents to urgent care for evaluation of laceration which was adhered by skin adhesion p 2 days ago.  Endorses that he put the wrong bandage over site and has been unable to remove.  Denies drainage, fever, chills.   Past Medical History:  Diagnosis Date   OSA on CPAP    Restless leg syndrome     Patient Active Problem List   Diagnosis Date Noted   CPAP use counseling 05/09/2020   OSA on CPAP    Restless leg syndrome     Past Surgical History:  Procedure Laterality Date   KNEE ARTHROSCOPY         Home Medications    Prior to Admission medications   Medication Sig Start Date End Date Taking? Authorizing Provider  azelastine (ASTELIN) 0.1 % nasal spray Place into the nose. 06/28/17 11/04/20  [provider]    Family History Family History  Problem Relation Age of Onset   Cancer Mother    Healthy Father     Social History Social History   Tobacco Use   Smoking status: Never   Smokeless tobacco: Never  Vaping Use   Vaping Use: Never used  Substance Use Topics   Alcohol use: Yes    Comment: occasional   Drug use: Never     Allergies   Patient has no known allergies.   Review of Systems Review of Systems   Physical Exam Triage Vital Signs ED Triage Vitals [08/16/21 1431]  Enc Vitals Group     BP      Pulse      Resp      Temp      Temp src      SpO2      Weight      Height      Head Circumference      Peak Flow      Pain Score 0     Pain Loc      Pain Edu?      Excl. in [place]?    No data found.  Updated Vital Signs There were no vitals taken for this visit.  Visual Acuity Right Eye Distance:   Left Eye Distance:   Bilateral Distance:    Right Eye Near:   Left Eye Near:    Bilateral  Near:     Physical Exam Constitutional:      Appearance: Normal appearance.  Eyes:     Extraocular Movements: Extraocular movements intact.  Pulmonary:     Effort: Pulmonary effort is normal.  Skin:    Comments: Skin glue still intact covering 98% of wound bed, scant bloody drainage, no drainage, erythema, tenderness noted  Neurological:     Mental Status: He is alert and oriented to person, place, and time. Mental status is at baseline.  Psychiatric:        Mood and Affect: Mood normal.        Behavior: Behavior normal.     UC Treatments / Results  Labs (all labs ordered are listed, but only abnormal results are displayed) Labs Reviewed - No data to display  EKG   Radiology No results found.  Procedures Procedures (including critical care  time)  Medications Ordered in UC Medications - No data to display  Initial Impression / Assessment and Plan / UC Course  I have reviewed the triage vital signs and the nursing notes.  Pertinent labs & imaging results that were available during my care of the patient were reviewed by me and considered in my medical decision making (see chart for details).  Laceration of left hand without foreign body, subsequent encounter Visit for wound check  Bandage removed by nursing staff, skin adhesive still intact, trimmed edges, no signs of infection, given strict precautions for signs of infection and when to return, work note given as patient endorses that he is unable to keep hand clean due to job, may follow-up with urgent care as needed Final Clinical Impressions(s) / UC Diagnoses   Final diagnoses:  None   Discharge Instructions   None    ED Prescriptions   None    PDMP not reviewed this encounter.   Hans Eden, NP 08/23/21 2020

## 2021-09-29 IMAGING — CR DG FEMUR 2+V*R*
4 series · 4 of 4 positions shown · non-contrast
Comparison: None.

CLINICAL DATA: Right thigh pain after fall several days ago.

EXAM:
RIGHT FEMUR 2 VIEWS

[femur ap (1 of 2)]
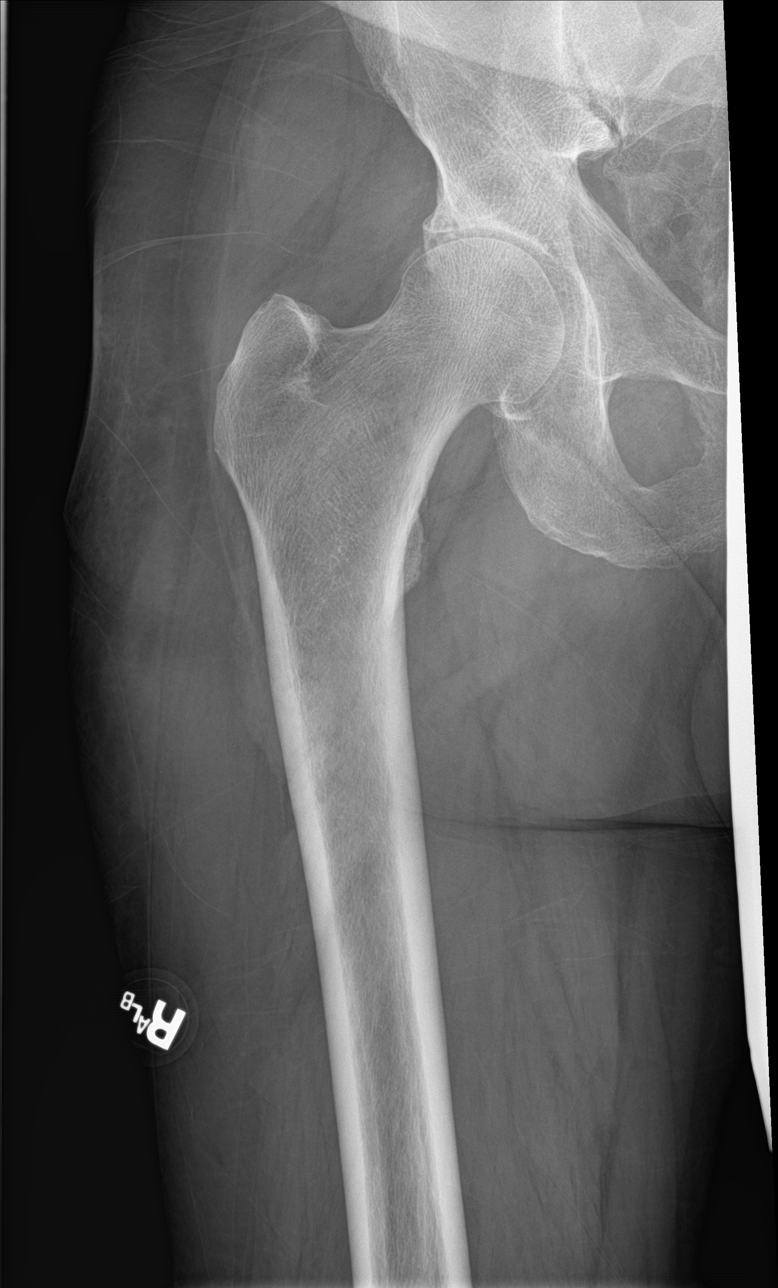

[femur ap (2 of 2)]
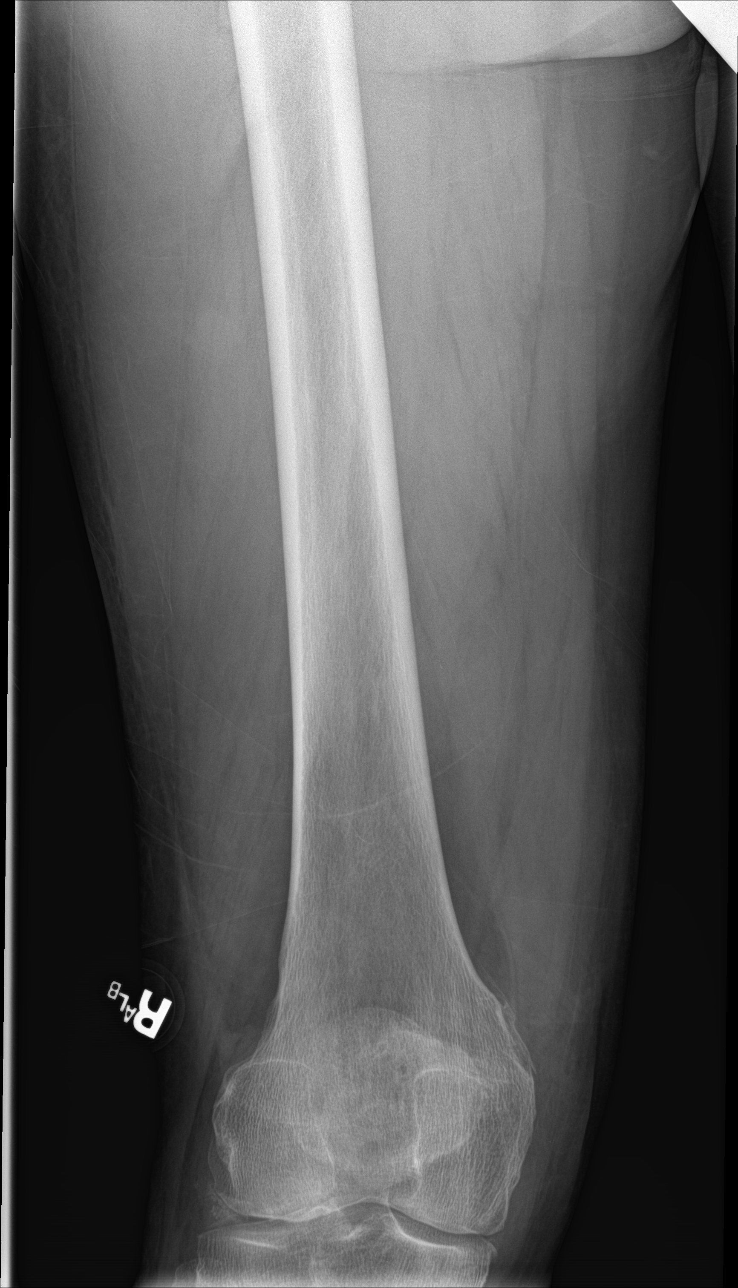

[femur lat (1 of 2)]
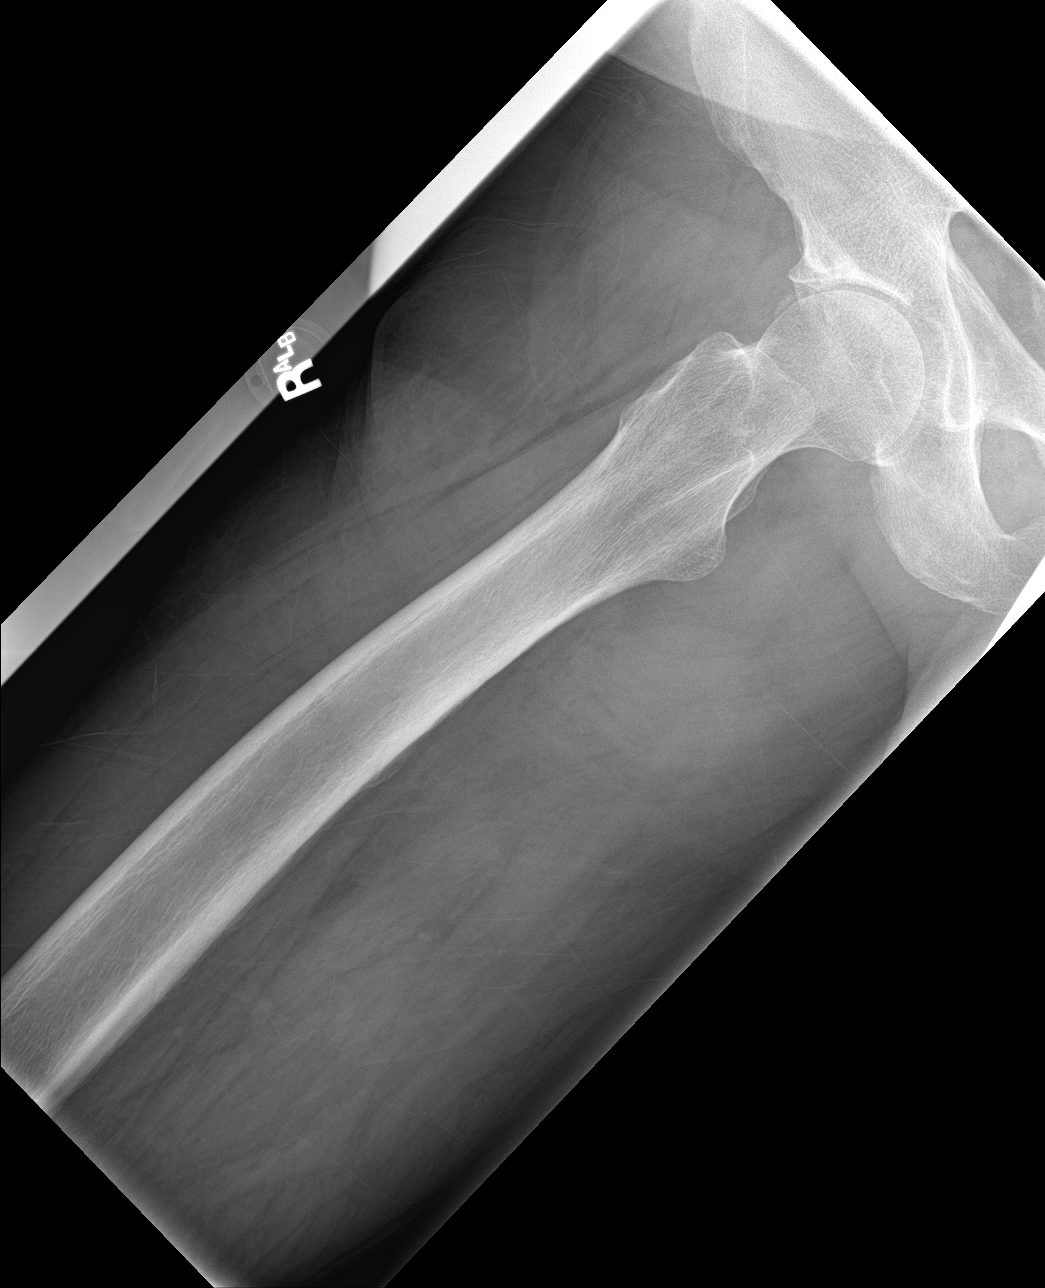

[femur lat (2 of 2)]
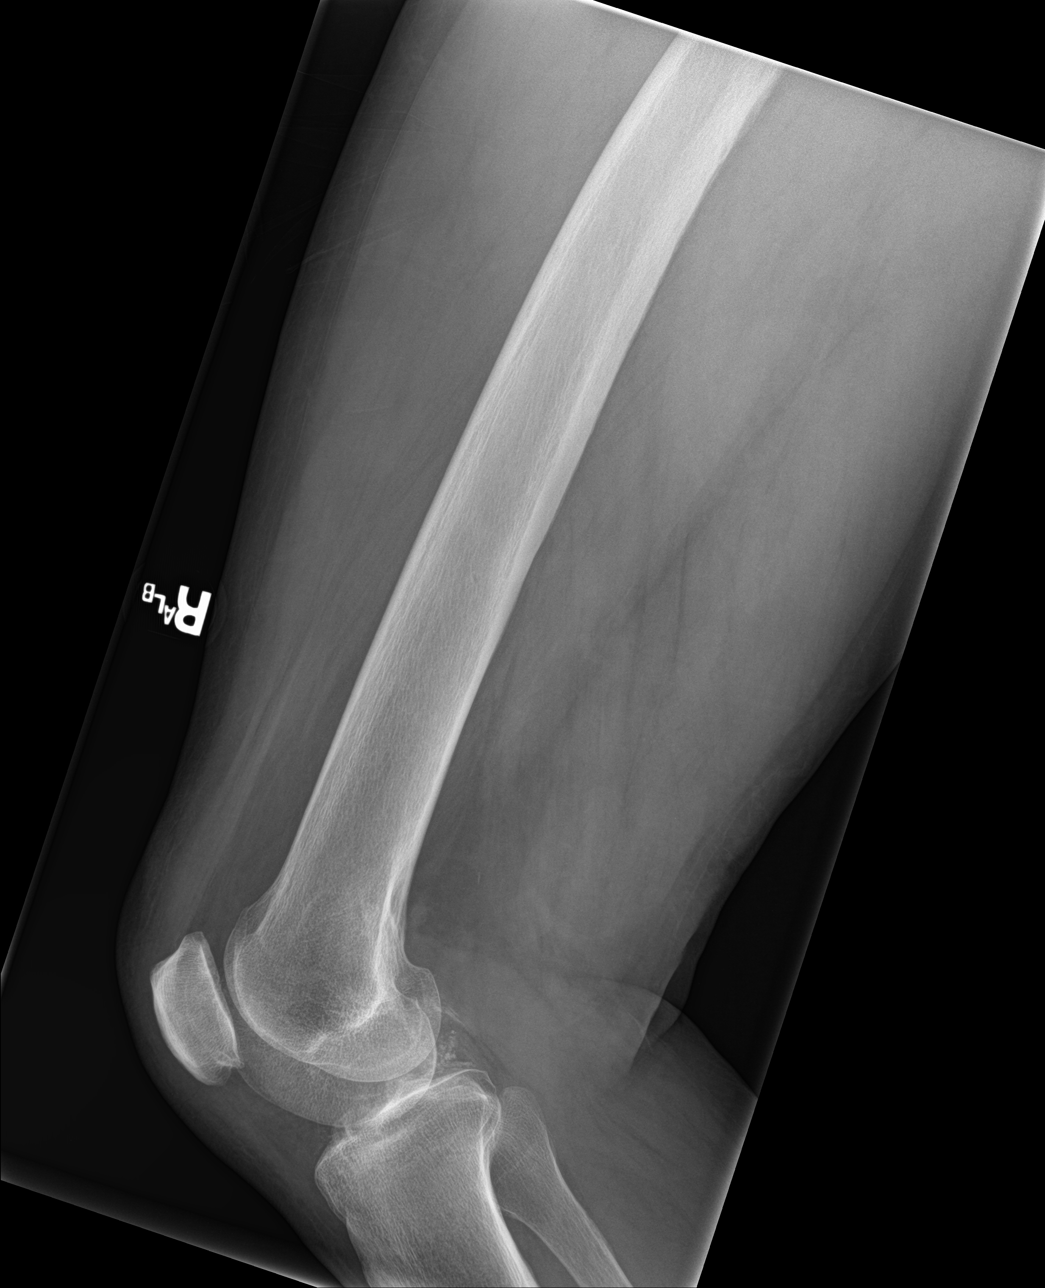

[4 of 4 positions shown; findings below may reference images not displayed]

FINDINGS: There is no evidence of fracture or other focal bone lesions. Soft
tissues are unremarkable.
IMPRESSION: Negative.

## 2021-10-16 ENCOUNTER — Ambulatory Visit (INDEPENDENT_AMBULATORY_CARE_PROVIDER_SITE_OTHER): Payer: Medicare Other | Admitting: Internal Medicine

## 2021-10-16 VITALS — BP 160/80 | HR 89 | Resp 16 | Ht 69.0 in | Wt 205.0 lb

## 2021-10-16 DIAGNOSIS — G4733 Obstructive sleep apnea (adult) (pediatric): Secondary | ICD-10-CM

## 2021-10-16 DIAGNOSIS — Z7189 Other specified counseling: Secondary | ICD-10-CM | POA: Diagnosis not present

## 2021-10-16 DIAGNOSIS — Z9989 Dependence on other enabling machines and devices: Secondary | ICD-10-CM

## 2021-10-16 DIAGNOSIS — G2581 Restless legs syndrome: Secondary | ICD-10-CM

## 2021-10-16 DIAGNOSIS — R030 Elevated blood-pressure reading, without diagnosis of hypertension: Secondary | ICD-10-CM

## 2021-10-16 DIAGNOSIS — D580 Hereditary spherocytosis: Secondary | ICD-10-CM | POA: Insufficient documentation

## 2021-10-16 NOTE — Patient Instructions (Signed)

## 2021-10-16 NOTE — Progress Notes (Signed)
[facility] ?[address] ?[place], [place] [address] ? ?Pulmonary Sleep Medicine  ? ?Office Visit Note ? ?Patient Name: Nathaniel Shaw ?DOB: 02-12-1956 ?MRN [id number] ? ? ? ?Chief Complaint: Obstructive Sleep Apnea visit ? ?Brief History: ? ?Winn is seen today for follow up to get face to face notes for a replacement machine. The patient has a 7.5 year history of sleep apnea. Patient is using PAP nightly on medium N20 nasal mask.  The patient feels pretty good  after sleeping with PAP.  The patient reports benefiting from PAP use. Epworth Sleepiness Score is 12 out of 24. The patient does take naps for hour on Sunday afternoon without PAP on.. The patient complains of the following: wanting new machine and different mask  The compliance download shows  excellent compliance with an average use time of 7:06 hours@ 100%. The AHI is 1.6  The patient does not complain of limb movements disrupting sleep. ? ?ROS ? ?General: (-) fever, (-) chills, (-) night sweat ?Nose and Sinuses: (-) nasal stuffiness or itchiness, (-) postnasal drip, (-) nosebleeds, (-) sinus trouble. ?Mouth and Throat: (-) sore throat, (-) hoarseness. ?Neck: (-) swollen glands, (-) enlarged thyroid, (-) neck pain. ?Respiratory: - cough, - shortness of breath, - wheezing. ?Neurologic: - numbness, - tingling. ?Psychiatric: - anxiety, - depression ? ? ?Current Medication: ?Outpatient Encounter Medications as of 10/16/2021  ?Medication Sig  ? [DISCONTINUED] azelastine (ASTELIN) 0.1 % nasal spray Place into the nose.  ? ?No facility-administered encounter medications on file as of 10/16/2021.  ? ? ?Surgical History: ?Past Surgical History:  ?Procedure Laterality Date  ? KNEE ARTHROSCOPY    ? ? ?Medical History: ?Past Medical History:  ?Diagnosis Date  ? OSA on CPAP   ? Restless leg syndrome   ? ? ?Family History: ?Non contributory to the present illness ? ?Social History: ?Social History  ? ?Socioeconomic History  ? Marital status: Married  ?   Spouse name: Not on file  ? Number of children: Not on file  ? Years of education: Not on file  ? Highest education level: Not on file  ?Occupational History  ? Not on file  ?Tobacco Use  ? Smoking status: Never  ? Smokeless tobacco: Never  ?Vaping Use  ? Vaping Use: Never used  ?Substance and Sexual Activity  ? Alcohol use: Yes  ?  Comment: occasional  ? Drug use: Never  ? Sexual activity: Not on file  ?Other Topics Concern  ? Not on file  ?Social History Narrative  ? Not on file  ? ?Social Determinants of Health  ? ?Financial Resource Strain: Not on file  ?Food Insecurity: Not on file  ?Transportation Needs: Not on file  ?Physical Activity: Not on file  ?Stress: Not on file  ?Social Connections: Not on file  ?Intimate Partner Violence: Not on file  ? ? ?Vital Signs: ?Blood pressure (!) 160/80, pulse 89, resp. rate 16, height '5\' 9"'$  (1.753 m), weight 205 lb (93 kg), SpO2 98 %. ?Body mass index is 30.27 kg/m?.  ? ? ?Examination: ?General Appearance: The patient is well-developed, well-nourished, and in no distress. ?Neck Circumference: 42 cm ?Skin: Gross inspection of skin unremarkable. ?Head: normocephalic, no gross deformities. ?Eyes: no gross deformities noted. ?ENT: ears appear grossly normal ?Neurologic: Alert and oriented. No involuntary movements. ? ? ? ?EPWORTH SLEEPINESS SCALE: ? ?Scale:  ?(0)= no chance of dozing; (1)= slight chance of dozing; (2)= moderate chance of dozing; (3)= high chance of dozing ? ?Chance  Situtation ?   ?Sitting and reading: 2 ?  ? Watching TV: 2 ?   ?Sitting Inactive in public: 1 ?   ?As a passenger in car: 2   ?   ?Lying down to rest: 2 ?   ?Sitting and talking: 1 ?   ?Sitting quielty after lunch: 2 ?   ?In a car, stopped in traffic: 0 ? ? ?TOTAL SCORE:   12 out of 24 ? ? ? ?SLEEP STUDIES: ? ?PSG 10/2013  -  AHI 17 SpO14mn 83% ? ? ?CPAP COMPLIANCE DATA: ? ?Date Range: 09/16/21  - 10/15/21 ? ?Average Daily Use: 7:06 hours ? ?Median Use: 6:49 hours ? ?Compliance for > 4 Hours: 100%  days ? ?AHI: 1.6 respiratory events per hour ? ?Days Used: 30/30 ? ?Mask Leak: 15.4 lpm ? ?95th Percentile Pressure: 10 cmH2O ? ? ? ?LABS: ?No results found for this or any previous visit (from the past 2160 hour(s)). ? ?Radiology: ?No results found. ? ?No results found. ? ?No results found. ? ? ? ?Assessment and Plan: ?Patient Active Problem List  ? Diagnosis Date Noted  ? [place] 10[place]16[place]  ? CPAP use counseling 05/09/2020  ? OSA on CPAP   ? Restless leg syndrome   ? ? ?1. OSA on CPAP ?The patient does tolerate PAP and reports  benefit from PAP use. Machine has reached end of life and must be replaced. The patient was reminded how to clean equipment and advised to replace supplies routinely. The patient was also counselled on his blood pressure. The compliance is excellent. The AHI is 1.6. ? ? ?OSA- replace machine. F/u 30d after setup.  ? ? ?2. CPAP use counseling ?CPAP Counseling: had a lengthy discussion with the patient regarding the importance of PAP therapy in management of the sleep apnea. Patient appears to understand the risk factor reduction and also understands the risks associated with untreated sleep apnea. Patient will try to make a good faith effort to remain compliant with therapy. Also instructed the patient on proper cleaning of the device including the water must be changed daily if possible and use of distilled water is preferred. Patient understands that the machine should be regularly cleaned with appropriate recommended cleaning solutions that do not damage the PAP machine for example given white vinegar and water rinses. Other methods such as ozone treatment may not be as good as these simple methods to achieve cleaning.  ? ?3. Restless leg syndrome ?Denies symptoms. Continue to monitor.   ? ?4. Elevated blood pressure without diagnosis of hypertension ?We discussed his blood pressure today. He will purchase a monitor and record his pressures at home and f/u with his  doctor.  ? ?General Counseling: I have discussed the findings of the evaluation and examination with WGwyndolyn Saxon  I have also discussed any further diagnostic evaluation thatmay be needed or ordered today. Burr verbalizes understanding of the findings of todays visit. We also reviewed his medications today and discussed drug interactions and side effects including but not limited excessive drowsiness and altered mental states. We also discussed that there is always a risk not just to him but also people around him. he has been encouraged to call the office with any questions or concerns that should arise related to todays visit. ? ?No orders of the defined types were placed in this encounter. ?  ? ? ? ? ?I have personally obtained a history, examined the patient, evaluated laboratory and imaging results, formulated the assessment and plan  and placed orders. ?This patient was seen today by Tressie Ellis, PA-C in collaboration with Dr. Devona Konig.  ? ?Allyne Gee, MD FCCP ?Diplomate ABMS Pulmonary Critical Care Medicine and Sleep Medicine ? ?

## 2021-12-19 ENCOUNTER — Ambulatory Visit
Admission: EM | Admit: 2021-12-19 | Discharge: 2021-12-19 | Disposition: A | Discharge status: Home / Self Care | Payer: Medicare Other

## 2021-12-19 ENCOUNTER — Encounter: Payer: Self-pay | Admitting: Emergency Medicine

## 2021-12-19 DIAGNOSIS — L259 Unspecified contact dermatitis, unspecified cause: Secondary | ICD-10-CM

## 2021-12-19 MED ORDER — PREDNISONE 10 MG (21) PO TBPK: ORAL_TABLET | ORAL | 0 refills | Status: DC

## 2021-12-19 NOTE — ED Provider Notes (Signed)
[facility]    CSN: [id number] Arrival date & time: 12/19/21  1845      History   Chief Complaint Chief Complaint  Patient presents with   Rash    HPI Nathaniel Shaw is a 66 y.o. male.   HPI  66 year old male here for evaluation of skin complaint.  Patient is here for evaluation of a rash on his left lower leg that he first noticed yesterday.  He states that it started on his left ankle and then in the ensuing day it has spread up his left lower leg.  He also noticed some appearing on his right lower leg this morning.  He states that the rash itches and burns.  He works as a Engineer, building services but he denies being exposed to any chemicals or solvents that may have caused a reaction.  He denies any new personal hygiene products or laundry detergent.  No new medications or supplements.  He has not noticed a rash anywhere else on his body.  He states that he did wear some tight socks yesterday when he went to work and is unsure if that may have caused the rash or not.  He states he has worn the socks previously.  Past Medical History:  Diagnosis Date   OSA on CPAP    Restless leg syndrome     Patient Active Problem List   Diagnosis Date Noted   Hereditary spherocytosis ([place]) 10/16/2021   Elevated blood pressure reading without diagnosis of hypertension 10/16/2021   CPAP use counseling 05/09/2020   OSA on CPAP    Restless leg syndrome     Past Surgical History:  Procedure Laterality Date   KNEE ARTHROSCOPY         Home Medications    Prior to Admission medications   Medication Sig Start Date End Date Taking? Authorizing Provider  predniSONE (STERAPRED UNI-PAK 21 TAB) 10 MG (21) TBPK tablet Take 6 tablets on day 1, 5 tablets day 2, 4 tablets day 3, 3 tablets day 4, 2 tablets day 5, 1 tablet day 6 12/19/21  Yes Margarette Canada, NP  doxycycline (VIBRAMYCIN) 100 MG capsule Take 100 mg by mouth 2 (two) times daily. 11/16/21   [provider]  lisinopril  (ZESTRIL) 5 MG tablet Take 5 mg by mouth 2 (two) times daily. 11/17/21   [provider]  traZODone (DESYREL) 50 MG tablet Take 50 mg by mouth at bedtime. 11/17/21   [provider]  azelastine (ASTELIN) 0.1 % nasal spray Place into the nose. 06/28/17 11/04/20  [provider]    Family History Family History  Problem Relation Age of Onset   Cancer Mother    Healthy Father     Social History Social History   Tobacco Use   Smoking status: Never   Smokeless tobacco: Never  Vaping Use   Vaping Use: Never used  Substance Use Topics   Alcohol use: Yes    Comment: occasional   Drug use: Never     Allergies   Patient has no known allergies.   Review of Systems Review of Systems  Skin:  Positive for color change and rash.  Hematological: Negative.   Psychiatric/Behavioral: Negative.       Physical Exam Triage Vital Signs ED Triage Vitals  Enc Vitals Group     BP 12/19/21 1906 (!) 148/86     Pulse Rate 12/19/21 1906 72     Resp 12/19/21 1906 18     Temp 12/19/21  1906 98.2 F (36.8 C)     Temp Source 12/19/21 1906 Oral     SpO2 12/19/21 1906 96 %     Weight --      Height --      Head Circumference --      Peak Flow --      Pain Score 12/19/21 1903 3     Pain Loc --      Pain Edu? --      Excl. in [place]? --    No data found.  Updated Vital Signs BP (!) 148/86 (BP Location: Right Arm)   Pulse 72   Temp 98.2 F (36.8 C) (Oral)   Resp 18   SpO2 96%   Visual Acuity Right Eye Distance:   Left Eye Distance:   Bilateral Distance:    Right Eye Near:   Left Eye Near:    Bilateral Near:     Physical Exam Vitals and nursing note reviewed.  Constitutional:      Appearance: Normal appearance. He is not ill-appearing.  Skin:    General: Skin is warm and dry.     Capillary Refill: Capillary refill takes less than 2 seconds.     Findings: Erythema and rash present.  Neurological:     General: No focal deficit present.     Mental  Status: He is alert and oriented to person, place, and time.  Psychiatric:        Mood and Affect: Mood normal.        Behavior: Behavior normal.        Thought Content: Thought content normal.        Judgment: Judgment normal.      UC Treatments / Results  Labs (all labs ordered are listed, but only abnormal results are displayed) Labs Reviewed - No data to display  EKG   Radiology No results found.  Procedures Procedures (including critical care time)  Medications Ordered in UC Medications - No data to display  Initial Impression / Assessment and Plan / UC Course  I have reviewed the triage vital signs and the nursing notes.  Pertinent labs & imaging results that were available during my care of the patient were reviewed by me and considered in my medical decision making (see chart for details).  She is a very pleasant, nontoxic appearing 66 year old male here for evaluation of a raised, erythematous rash on both lower extremities.  The rash is more prominent on the left lower extremity than the right.  The rash consists of mildly raised erythematous plaques that are warm to touch.  The rash is nonblanchable.  The patient's DP and PT pulses are 2+ and both feet.  There is no swelling to the extremities and this does not resemble cellulitis.  The rash appears to be some sort of dermatitis.  I will treat the patient with prednisone and antihistamines.  I have also advised him that when he gets home from work that he should shower thoroughly and then keep the rash exposed to the air is much as possible to find a bacterial secondary infection from forming.  If the rash does not improve, or worsens, he needs to return for reevaluation or see dermatology.   Final Clinical Impressions(s) / UC Diagnoses   Final diagnoses:  Contact dermatitis, unspecified contact dermatitis type, unspecified trigger     Discharge Instructions      Take over-the-counter Allegra 180 mg daily or  Zyrtec or Claritin 10 mg daily to help  with your itching.  You can take over-the-counter Benadryl, 50 mg at bedtime, as needed for itching and sleep.  Take the prednisone pack according to the package instructions.  You will taken on tapering dose over a period of 6 days.  Take it with food and always take it first in the morning with breakfast.  Take over-the-counter Pepcid 20 mg twice daily to help with itching as well.  If you develop any swelling of your lips or tongue, tightness in your throat, or difficulty breathing you need to go to the ER for evaluation. If the rash does not improve, or it worsens you need to see dermatology.     ED Prescriptions     Medication Sig Dispense Auth. Provider   predniSONE (STERAPRED UNI-PAK 21 TAB) 10 MG (21) TBPK tablet Take 6 tablets on day 1, 5 tablets day 2, 4 tablets day 3, 3 tablets day 4, 2 tablets day 5, 1 tablet day 6 21 tablet Margarette Canada, NP      PDMP not reviewed this encounter.   Margarette Canada, NP 12/19/21 1923

## 2021-12-19 NOTE — ED Triage Notes (Signed)
Pt presents with c/o a rash on left lower leg. States he first noticed the rash around his left ankle yesterday and since then has spread up the leg. Also reports noticing the same red rash appearing on the right lower leg.

## 2021-12-19 NOTE — Discharge Instructions (Signed)
Take over-the-counter Allegra 180 mg daily or Zyrtec or Claritin 10 mg daily to help with your itching.  You can take over-the-counter Benadryl, 50 mg at bedtime, as needed for itching and sleep.  Take the prednisone pack according to the package instructions.  You will taken on tapering dose over a period of 6 days.  Take it with food and always take it first in the morning with breakfast.  Take over-the-counter Pepcid 20 mg twice daily to help with itching as well.  If you develop any swelling of your lips or tongue, tightness in your throat, or difficulty breathing you need to go to the ER for evaluation. If the rash does not improve, or it worsens you need to see dermatology.

## 2021-12-25 ENCOUNTER — Ambulatory Visit (INDEPENDENT_AMBULATORY_CARE_PROVIDER_SITE_OTHER): Payer: Medicare Other | Admitting: Internal Medicine

## 2021-12-25 VITALS — BP 139/69 | HR 79 | Resp 18 | Ht 69.0 in | Wt 196.0 lb

## 2021-12-25 DIAGNOSIS — Z7189 Other specified counseling: Secondary | ICD-10-CM | POA: Diagnosis not present

## 2021-12-25 DIAGNOSIS — G2581 Restless legs syndrome: Secondary | ICD-10-CM

## 2021-12-25 DIAGNOSIS — G4733 Obstructive sleep apnea (adult) (pediatric): Secondary | ICD-10-CM | POA: Diagnosis not present

## 2021-12-25 DIAGNOSIS — Z9989 Dependence on other enabling machines and devices: Secondary | ICD-10-CM

## 2021-12-25 NOTE — Patient Instructions (Signed)

## 2021-12-25 NOTE — Progress Notes (Signed)
[facility] [place], [place] [address]  Pulmonary Sleep Medicine   Office Visit Note  Patient Name: LARRELL RAPOZO DOB: 11-16-1955 MRN [id number]    Chief Complaint: Obstructive Sleep Apnea visit  Brief History:  Gershom is seen today for follow up post setup. The patient has a 7.5 year history of sleep apnea. Patient is using PAP nightly medium F30 full face mask.  The patient feels better after sleeping with PAP.  Epworth score is 11.   The patient does not take naps. The patient complains of the following: wanting longer tubing.   The compliance download shows  compliance with an average use time of 7:74 hours.@ 100%.  Patient continues to need  PAP to treat OSA and therapy continues to be medically necessary. The AHI is 4.0  The patient does not complain of limb movements disrupting sleep.  ROS  General: (-) fever, (-) chills, (-) night sweat Nose and Sinuses: (-) nasal stuffiness or itchiness, (-) postnasal drip, (-) nosebleeds, (-) sinus trouble. Mouth and Throat: (-) sore throat, (-) hoarseness. Neck: (-) swollen glands, (-) enlarged thyroid, (-) neck pain. Respiratory: - cough, - shortness of breath, - wheezing. Neurologic: - numbness, - tingling. Psychiatric: - anxiety, - depression   Current Medication: Outpatient Encounter Medications as of 12/25/2021  Medication Sig   lisinopril (ZESTRIL) 5 MG tablet Take 1 tablet by mouth 2 (two) times daily.   traZODone (DESYREL) 50 MG tablet Take 1 tablet by mouth at bedtime.   lisinopril (ZESTRIL) 5 MG tablet Take 5 mg by mouth 2 (two) times daily.   traZODone (DESYREL) 50 MG tablet Take 50 mg by mouth at bedtime.   [DISCONTINUED] azelastine (ASTELIN) 0.1 % nasal spray Place into the nose.   [DISCONTINUED] doxycycline (VIBRAMYCIN) 100 MG capsule Take 100 mg by mouth 2 (two) times daily.   [DISCONTINUED] predniSONE (STERAPRED UNI-PAK 21 TAB) 10 MG (21) TBPK tablet Take 6 tablets on day 1, 5 tablets day  2, 4 tablets day 3, 3 tablets day 4, 2 tablets day 5, 1 tablet day 6   No facility-administered encounter medications on file as of 12/25/2021.    Surgical History: Past Surgical History:  Procedure Laterality Date   KNEE ARTHROSCOPY      Medical History: Past Medical History:  Diagnosis Date   OSA on CPAP    Restless leg syndrome     Family History: Non contributory to the present illness  Social History: Social History   Socioeconomic History   Marital status: Married    Spouse name: Not on file   Number of children: Not on file   Years of education: Not on file   Highest education level: Not on file  Occupational History   Not on file  Tobacco Use   Smoking status: Never   Smokeless tobacco: Never  Vaping Use   Vaping Use: Never used  Substance and Sexual Activity   Alcohol use: Yes    Comment: occasional   Drug use: Never   Sexual activity: Not on file  Other Topics Concern   Not on file  Social History Narrative   Not on file   Social Determinants of Health   Financial Resource Strain: Not on file  Food Insecurity: Not on file  Transportation Needs: Not on file  Physical Activity: Not on file  Stress: Not on file  Social Connections: Not on file  Intimate Partner Violence: Not on file    Vital Signs: Blood pressure 139/69,  pulse 79, resp. rate 18, height '5\' 9"'$  (1.753 m), weight 196 lb (88.9 kg), SpO2 98 %. Body mass index is 28.94 kg/m.    Examination: General Appearance: The patient is well-developed, well-nourished, and in no distress. Neck Circumference: 42 cm Skin: Gross inspection of skin unremarkable. Head: normocephalic, no gross deformities. Eyes: no gross deformities noted. ENT: ears appear grossly normal Neurologic: Alert and oriented. No involuntary movements.    EPWORTH SLEEPINESS SCALE:  Scale:  (0)= no chance of dozing; (1)= slight chance of dozing; (2)= moderate chance of dozing; (3)= high chance of dozing  Chance   Situtation    Sitting and reading: 1    Watching TV: 2    Sitting Inactive in public: 1    As a passenger in car: 1      Lying down to rest: 3    Sitting and talking: 1    Sitting quielty after lunch: 2    In a car, stopped in traffic: 0   TOTAL SCORE:   11 out of 24    SLEEP STUDIES:  PSG 10/2013  -  AHI 17 SpO54mn 83%   CPAP COMPLIANCE DATA:  Date Range: 11/19/21 - 12/18/21  Average Daily Use: 7:47 hours  Median Use: 7:32 hours  Compliance for > 4 Hours: 100% days  AHI: 4.0 respiratory events per hour  Days Used: 30/30  Mask Leak: 3.8 lpm  95th Percentile Pressure: 10 cmH2O   LABS: No results found for this or any previous visit (from the past 2160 hour(s)).  Radiology: No results found.  No results found.  No results found.    Assessment and Plan: Patient Active Problem List   Diagnosis Date Noted   Hereditary spherocytosis ([place] 10/16/2021   Elevated blood pressure reading without diagnosis of hypertension 10/16/2021   CPAP use counseling 05/09/2020   OSA on CPAP    Restless leg syndrome     1. OSA on CPAP The patient does tolerate PAP and reports  benefit from PAP use. The patient was reminded how to clean equipment  and advised to replace supplies routinely. The patient was also counselled on weight loss. The compliance is excellent. The AHI is 4.0.   OSA on cpap- CPAP continues to be medically necessary to treat this patient's OSA. Continue with excellent compliance with pap.  F/u one year.    2. CPAP use counseling CPAP Counseling: had a lengthy discussion with the patient regarding the importance of PAP therapy in management of the sleep apnea. Patient appears to understand the risk factor reduction and also understands the risks associated with untreated sleep apnea. Patient will try to make a good faith effort to remain compliant with therapy. Also instructed the patient on proper cleaning of the device including the water must be  changed daily if possible and use of distilled water is preferred. Patient understands that the machine should be regularly cleaned with appropriate recommended cleaning solutions that do not damage the PAP machine for example given white vinegar and water rinses. Other methods such as ozone treatment may not be as good as these simple methods to achieve cleaning.   3. Restless leg syndrome Asymptomatic at present. Continue to monitor.    General Counseling: I have discussed the findings of the evaluation and examination with WGwyndolyn Saxon  I have also discussed any further diagnostic evaluation thatmay be needed or ordered today. Layla verbalizes understanding of the findings of todays visit. We also reviewed his medications today and discussed drug interactions  and side effects including but not limited excessive drowsiness and altered mental states. We also discussed that there is always a risk not just to him but also people around him. he has been encouraged to call the office with any questions or concerns that should arise related to todays visit.  No orders of the defined types were placed in this encounter.       I have personally obtained a history, examined the patient, evaluated laboratory and imaging results, formulated the assessment and plan and placed orders. This patient was seen today by Tressie Ellis, PA-C in collaboration with Dr. Devona Konig.   Allyne Gee, MD [facility] Diplomate ABMS Pulmonary Critical Care Medicine and Sleep Medicine

## 2022-01-21 ENCOUNTER — Encounter: Payer: Self-pay | Admitting: Emergency Medicine

## 2022-01-21 ENCOUNTER — Ambulatory Visit
Admission: EM | Admit: 2022-01-21 | Discharge: 2022-01-21 | Disposition: A | Discharge status: Home / Self Care | Payer: Medicare Other | Attending: Emergency Medicine | Admitting: Emergency Medicine

## 2022-01-21 DIAGNOSIS — L247 Irritant contact dermatitis due to plants, except food: Secondary | ICD-10-CM

## 2022-01-21 DIAGNOSIS — L568 Other specified acute skin changes due to ultraviolet radiation: Secondary | ICD-10-CM | POA: Diagnosis not present

## 2022-01-21 MED ORDER — FEXOFENADINE HCL 60 MG PO TABS: 60.0000 mg | ORAL_TABLET | Freq: Two times a day (BID) | ORAL | 0 refills | Status: DC

## 2022-01-21 NOTE — ED Triage Notes (Signed)
Patient states that he was exposed to poison ivy on 01/12/22.  Patient states that he developed an itchy rash 3 days later.  Patient states that he still has itchiness especially when he is in the sun. Patient denies taking any Benadryl or allergy medicine.

## 2022-01-21 NOTE — Discharge Instructions (Addendum)
Take the Allegra twice daily to help with itch.  Avoid the sun as much as possible. Wear sunscreen when in the sun.  Wear a wide brimmed hat to limit sun exposure on your face.  Change your nitrile gloves often and allow air to get to your hands.  Return for new or worsening symptoms.

## 2022-01-21 NOTE — ED Provider Notes (Signed)
[facility]    CSN: [id number] Arrival date & time: 01/21/22  2376      History   Chief Complaint Chief Complaint  Patient presents with   Rash    HPI Nathaniel Shaw is a 66 y.o. male.   HPI  65 year old male here for evaluation of skin complaint.  Patient reports back on 01/12/2022 he was exposed to some poison oak.  Approximately 3 days later a rash developed and he still has a small area of redness on the back of his left hand and the front of his left ankle.  He states that when he is in the sun he becomes itchy but he mostly experiences this around his eyes and on his face.  He has no lesions there.  He has not tried any Benadryl or over-the-counter antihistamines.  The itching is not present unless he is hot or in the sun.  Past Medical History:  Diagnosis Date   OSA on CPAP    Restless leg syndrome     Patient Active Problem List   Diagnosis Date Noted   Hereditary spherocytosis ([place]) 10/16/2021   Elevated blood pressure reading without diagnosis of hypertension 10/16/2021   CPAP use counseling 05/09/2020   OSA on CPAP    Restless leg syndrome     Past Surgical History:  Procedure Laterality Date   KNEE ARTHROSCOPY         Home Medications    Prior to Admission medications   Medication Sig Start Date End Date Taking? Authorizing Provider  lisinopril (ZESTRIL) 5 MG tablet Take 5 mg by mouth 2 (two) times daily. 11/17/21  Yes [provider]  fexofenadine (ALLEGRA) 60 MG tablet Take 1 tablet (60 mg total) by mouth 2 (two) times daily. 01/21/22 02/20/22  Margarette Canada, NP  traZODone (DESYREL) 50 MG tablet Take 50 mg by mouth at bedtime. 11/17/21   [provider]  azelastine (ASTELIN) 0.1 % nasal spray Place into the nose. 06/28/17 11/04/20  [provider]    Family History Family History  Problem Relation Age of Onset   Cancer Mother    Healthy Father     Social History Social History   Tobacco Use   Smoking  status: Never   Smokeless tobacco: Never  Vaping Use   Vaping Use: Never used  Substance Use Topics   Alcohol use: Yes    Comment: occasional   Drug use: Never     Allergies   Patient has no known allergies.   Review of Systems Review of Systems  Constitutional:  Negative for fever.  HENT:  Negative for trouble swallowing.   Respiratory:  Negative for shortness of breath.   Skin:  Positive for rash.  Hematological: Negative.   Psychiatric/Behavioral: Negative.       Physical Exam Triage Vital Signs ED Triage Vitals  Enc Vitals Group     BP 01/21/22 0838 (!) 144/82     Pulse Rate 01/21/22 0838 71     Resp 01/21/22 0838 15     Temp 01/21/22 0838 98.3 F (36.8 C)     Temp Source 01/21/22 0838 Oral     SpO2 01/21/22 0838 99 %     Weight 01/21/22 0836 199 lb (90.3 kg)     Height 01/21/22 0836 '5\' 9"'$  (1.753 m)     Head Circumference --      Peak Flow --      Pain Score 01/21/22 0836 0     Pain  Loc --      Pain Edu? --      Excl. in [place]? --    No data found.  Updated Vital Signs BP (!) 144/82 (BP Location: Left Arm)   Pulse 71   Temp 98.3 F (36.8 C) (Oral)   Resp 15   Ht '5\' 9"'$  (1.753 m)   Wt 199 lb (90.3 kg)   SpO2 99%   BMI 29.39 kg/m   Visual Acuity Right Eye Distance:   Left Eye Distance:   Bilateral Distance:    Right Eye Near:   Left Eye Near:    Bilateral Near:     Physical Exam Vitals and nursing note reviewed.  Constitutional:      Appearance: Normal appearance. He is not ill-appearing.  HENT:     Head: Normocephalic and atraumatic.  Skin:    General: Skin is dry.     Capillary Refill: Capillary refill takes less than 2 seconds.     Findings: Rash present. No erythema.  Neurological:     General: No focal deficit present.     Mental Status: He is alert and oriented to person, place, and time.  Psychiatric:        Mood and Affect: Mood normal.        Behavior: Behavior normal.        Thought Content: Thought content normal.         Judgment: Judgment normal.      UC Treatments / Results  Labs (all labs ordered are listed, but only abnormal results are displayed) Labs Reviewed - No data to display  EKG   Radiology No results found.  Procedures Procedures (including critical care time)  Medications Ordered in UC Medications - No data to display  Initial Impression / Assessment and Plan / UC Course  I have reviewed the triage vital signs and the nursing notes.  Pertinent labs & imaging results that were available during my care of the patient were reviewed by me and considered in my medical decision making (see chart for details).  Patient is a pleasant, nontoxic-appearing 15 old male here seeking advice on what to do about experiencing itchiness when he is in the sun or when he is hot.  He thought it might be related to poison oak exposure approximately 9 days ago.  He has a small area of redness on the posterior lateral aspect of the proximal hand that is not pustular or vesicular.  He also reports a small area on his anterior left ankle which appears to be flat and red as well.  No vesicles or pustules noted.  His complaint of itchiness is on his face and around his eyes but there are no lesions on the patient's face.  I have advised him to keep the lesions on his hands open to air is much as possible so they can dry out and try Allegra 60 mg twice daily to help with the itching.  His description sounds more like a photosensitivity reaction.  I have also advised him to wear a widebrimmed hat when he is outside to keep sun off of his face and wear sunscreen.  He should minimize the sun exposure.  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  I have advised him to change his [place] frequently and allowing his hands to breathe through contact with the outside air frequently.  If he develops any new or worsening symptoms  he should return for  reevaluation.   Final Clinical Impressions(s) / UC Diagnoses   Final diagnoses:  Irritant contact dermatitis due to plants, except food  Photosensitivity due to sun     Discharge Instructions      Take the Allegra twice daily to help with itch.  Avoid the sun as much as possible. Wear sunscreen when in the sun.  Wear a wide brimmed hat to limit sun exposure on your face.  Change your nitrile gloves often and allow air to get to your hands.  Return for new or worsening symptoms.      ED Prescriptions     Medication Sig Dispense Auth. Provider   fexofenadine (ALLEGRA) 60 MG tablet  (Status: Discontinued) Take 1 tablet (60 mg total) by mouth 2 (two) times daily. 60 tablet Margarette Canada, NP   fexofenadine (ALLEGRA) 60 MG tablet Take 1 tablet (60 mg total) by mouth 2 (two) times daily. 60 tablet Margarette Canada, NP      PDMP not reviewed this encounter.   Margarette Canada, NP 01/21/22 [phone]

## 2022-04-22 ENCOUNTER — Encounter: Payer: Self-pay | Admitting: Emergency Medicine

## 2022-04-22 ENCOUNTER — Ambulatory Visit
Admission: EM | Admit: 2022-04-22 | Discharge: 2022-04-22 | Disposition: A | Discharge status: Home / Self Care | Payer: Medicare Other | Attending: Physician Assistant | Admitting: Physician Assistant

## 2022-04-22 DIAGNOSIS — Z1152 Encounter for screening for COVID-19: Secondary | ICD-10-CM | POA: Insufficient documentation

## 2022-04-22 DIAGNOSIS — R0981 Nasal congestion: Secondary | ICD-10-CM | POA: Diagnosis present

## 2022-04-22 DIAGNOSIS — J069 Acute upper respiratory infection, unspecified: Secondary | ICD-10-CM | POA: Diagnosis present

## 2022-04-22 DIAGNOSIS — R051 Acute cough: Secondary | ICD-10-CM

## 2022-04-22 DIAGNOSIS — Z79899 Other long term (current) drug therapy: Secondary | ICD-10-CM | POA: Diagnosis not present

## 2022-04-22 LAB — SARS CORONAVIRUS 2 BY RT PCR: SARS Coronavirus 2 by RT PCR: NEGATIVE

## 2022-04-22 MED ORDER — BENZONATATE 200 MG PO CAPS: 200.0000 mg | ORAL_CAPSULE | Freq: Three times a day (TID) | ORAL | 0 refills | Status: DC | PRN

## 2022-04-22 MED ORDER — IPRATROPIUM BROMIDE 0.06 % NA SOLN: 2.0000 | Freq: Four times a day (QID) | NASAL | 0 refills | Status: DC

## 2022-04-22 NOTE — ED Triage Notes (Signed)
Pt c/o nasal congestion, cough, sinus pressure. Started about 2 days ago. He has not tried anything.

## 2022-04-22 NOTE — ED Provider Notes (Signed)
[facility]    CSN: [id number] Arrival date & time: 04/22/22  0807      History   Chief Complaint Chief Complaint  Patient presents with   Nasal Congestion   Cough    HPI Nathaniel Shaw is a 66 y.o. male presenting for 2 day history of nasal congestion, cough, sinus pressure.  Patient believes he may have a sinus infection.  Denies fever, fatigue, body aches, sore throat, body aches, chest pain, SOB, abdominal pain, n/v/d. Denies sick contacts. Not taking any OTC medications or treating condition in any way.   PMH is significant for hypertension, OSA, hereditary spherocytosis, anxiety, and insomnia.  Has taken the lisinopril this morning.  He takes 5 mg twice daily.  He says his blood pressure is normally fine.  He is concerned potentially could be elevated due to being in the doctor's office.  HPI  Past Medical History:  Diagnosis Date   OSA on CPAP    Restless leg syndrome     Patient Active Problem List   Diagnosis Date Noted   Hereditary spherocytosis ([place]) 10/16/2021   Elevated blood pressure reading without diagnosis of hypertension 10/16/2021   CPAP use counseling 05/09/2020   OSA on CPAP    Restless leg syndrome     Past Surgical History:  Procedure Laterality Date   KNEE ARTHROSCOPY         Home Medications    Prior to Admission medications   Medication Sig Start Date End Date Taking? Authorizing Provider  benzonatate (TESSALON) 200 MG capsule Take 1 capsule (200 mg total) by mouth 3 (three) times daily as needed for cough. 04/22/22  Yes Laurene Footman B, PA-C  ipratropium (ATROVENT) 0.06 % nasal spray Place 2 sprays into both nostrils 4 (four) times daily. 04/22/22  Yes Laurene Footman B, PA-C  lisinopril (ZESTRIL) 5 MG tablet Take 5 mg by mouth 2 (two) times daily. 11/17/21  Yes [provider]  meloxicam (MOBIC) 15 MG tablet Take 15 mg by mouth daily. 04/19/22  Yes [provider]  traZODone (DESYREL) 50 MG tablet Take 50  mg by mouth at bedtime. 11/17/21  Yes [provider]  fexofenadine (ALLEGRA) 60 MG tablet Take 1 tablet (60 mg total) by mouth 2 (two) times daily. 01/21/22 02/20/22  Margarette Canada, NP  azelastine (ASTELIN) 0.1 % nasal spray Place into the nose. 06/28/17 11/04/20  [provider]    Family History Family History  Problem Relation Age of Onset   Cancer Mother    Healthy Father     Social History Social History   Tobacco Use   Smoking status: Never   Smokeless tobacco: Never  Vaping Use   Vaping Use: Never used  Substance Use Topics   Alcohol use: Yes    Comment: occasional   Drug use: Never     Allergies   Patient has no known allergies.   Review of Systems Review of Systems  Constitutional:  Negative for fatigue and fever.  HENT:  Positive for congestion, postnasal drip, rhinorrhea and sinus pressure. Negative for ear pain, sinus pain and sore throat.   Respiratory:  Positive for cough. Negative for shortness of breath.   Cardiovascular:  Negative for chest pain.  Gastrointestinal:  Negative for abdominal pain, diarrhea, nausea and vomiting.  Musculoskeletal:  Negative for myalgias.  Neurological:  Negative for weakness, light-headedness and headaches.  Hematological:  Negative for adenopathy.     Physical Exam Triage Vital Signs ED Triage Vitals  Enc Vitals Group     BP      Pulse      Resp      Temp      Temp src      SpO2      Weight      Height      Head Circumference      Peak Flow      Pain Score      Pain Loc      Pain Edu?      Excl. in [place]?    No data found.  Updated Vital Signs BP (!) 199/88 (BP Location: Left Arm)   Pulse 79   Temp 98.6 F (37 C) (Oral)   Resp 18   Ht '5\' 9"'$  (1.753 m)   Wt 199 lb 1.2 oz (90.3 kg)   SpO2 97%   BMI 29.40 kg/m     BP readings from last 3 visits:   01/21/22: 144/82 12/21/21: 123/65 11/16/21: 150/79  Physical Exam Vitals and nursing note reviewed.  Constitutional:      General: He  is not in acute distress.    Appearance: Normal appearance. He is well-developed. He is not ill-appearing.  HENT:     Head: Normocephalic and atraumatic.     Right Ear: Tympanic membrane, ear canal and external ear normal.     Left Ear: Tympanic membrane, ear canal and external ear normal.     Nose: Congestion present.     Mouth/Throat:     Mouth: Mucous membranes are moist.     Pharynx: Oropharynx is clear. Posterior oropharyngeal erythema (mild with clear PND) present.  Eyes:     General: No scleral icterus.    Conjunctiva/sclera: Conjunctivae normal.  Cardiovascular:     Rate and Rhythm: Normal rate and regular rhythm.     Heart sounds: No murmur heard. Pulmonary:     Effort: Pulmonary effort is normal. No respiratory distress.     Breath sounds: Normal breath sounds.  Musculoskeletal:     Cervical back: Neck supple.  Skin:    General: Skin is warm and dry.     Capillary Refill: Capillary refill takes less than 2 seconds.  Neurological:     General: No focal deficit present.     Mental Status: He is alert. Mental status is at baseline.     Motor: No weakness.     Gait: Gait normal.  Psychiatric:        Mood and Affect: Mood normal.        Behavior: Behavior normal.      UC Treatments / Results  Labs (all labs ordered are listed, but only abnormal results are displayed) Labs Reviewed  SARS CORONAVIRUS 2 BY RT PCR    EKG   Radiology No results found.  Procedures Procedures (including critical care time)  Medications Ordered in UC Medications - No data to display  Initial Impression / Assessment and Plan / UC Course  I have reviewed the triage vital signs and the nursing notes.  Pertinent labs & imaging results that were available during my care of the patient were reviewed by me and considered in my medical decision making (see chart for details).   66 year old male presenting for 2-day history of cough, congestion and sinus pressure.  Denies fever or  breathing difficulty.  BP is 199/88.  Re-check is 173/93. Other vitals normal and stable.  Patient does have history of hypertension and takes lisinopril.  I have included the blood  pressure readings from the past 3 visits which have not been nearly as high.  He does have situational anxiety.  On exam he has nasal congestion with clearish drainage and erythema posterior pharynx with clear postnasal drainage.  Chest is clear auscultation heart regular rate and rhythm.  PCR COVID test obtained.  Advised patient we will contact him with the COVID results if positive.  Reviewed current CDC guidelines, isolation protocol and ED precautions if COVID-positive.  We will send antiviral medication if COVID-positive.  I have advised that if he does not hear from [place], testing is negative and he has another virus.  Supportive care encouraged with increasing rest and fluids.  Advised Coricidin HBP over-the-counter.  Also sent benzonatate and Atrovent nasal spray.  Advised to return if not improving over 7 to 10 days or if he develops a fever or worsening symptoms.  Discussed with patient that he needs to check his blood pressure at home and if it is consistently elevated keep a log and follow-up with his PCP.  It is possible his spike in blood pressure could just be due to situational anxiety which she has a history of.  Continue taking at home BP meds.  Negative COVID test.   Final Clinical Impressions(s) / UC Diagnoses   Final diagnoses:  Viral upper respiratory tract infection  Acute cough  Nasal congestion     Discharge Instructions      -We will call if the COVID test is positive.  If you are positive for COVID I can send antiviral medication and you would need to isolate 5 days from symptom onset and wear a mask for 5 days. - If you do not hear from [place] that test is negative and you likely have another viral upper respiratory condition. - Start over-the-counter Coricidin HBP.  This will not raise  your blood pressure like other cough medicines.  I did send a cough pill that you can take and a nasal spray. - Increase rest and fluids.  Most the time upper respiratory infections get better in 7 to 10 days but if you develop fever, worsening sinus pain, chest pain or shortness of breath you should be seen again and reevaluated. - Keep an eye on your blood pressure and check it when you are at home.  Keep a log of your blood pressure especially if it continues to be elevated.  It could just be elevated due to the situation of being in the clinic but if it continues to be high at home you should follow back up with your PCP as you may need medication change.     ED Prescriptions     Medication Sig Dispense Auth. Provider   benzonatate (TESSALON) 200 MG capsule Take 1 capsule (200 mg total) by mouth 3 (three) times daily as needed for cough. 30 capsule Laurene Footman B, PA-C   ipratropium (ATROVENT) 0.06 % nasal spray Place 2 sprays into both nostrils 4 (four) times daily. 15 mL Danton Clap, PA-C      PDMP not reviewed this encounter.   Danton Clap, PA-C 04/22/22 [phone]

## 2022-04-22 NOTE — Discharge Instructions (Addendum)
-  We will call if the COVID test is positive.  If you are positive for COVID I can send antiviral medication and you would need to isolate 5 days from symptom onset and wear a mask for 5 days. - If you do not hear from [place] that test is negative and you likely have another viral upper respiratory condition. - Start over-the-counter Coricidin HBP.  This will not raise your blood pressure like other cough medicines.  I did send a cough pill that you can take and a nasal spray. - Increase rest and fluids.  Most the time upper respiratory infections get better in 7 to 10 days but if you develop fever, worsening sinus pain, chest pain or shortness of breath you should be seen again and reevaluated. - Keep an eye on your blood pressure and check it when you are at home.  Keep a log of your blood pressure especially if it continues to be elevated.  It could just be elevated due to the situation of being in the clinic but if it continues to be high at home you should follow back up with your PCP as you may need medication change.

## 2022-05-03 ENCOUNTER — Ambulatory Visit
Admission: EM | Admit: 2022-05-03 | Discharge: 2022-05-03 | Disposition: A | Discharge status: Home / Self Care | Payer: Medicare Other | Attending: Emergency Medicine | Admitting: Emergency Medicine

## 2022-05-03 ENCOUNTER — Ambulatory Visit (INDEPENDENT_AMBULATORY_CARE_PROVIDER_SITE_OTHER): Payer: Medicare Other

## 2022-05-03 DIAGNOSIS — K5900 Constipation, unspecified: Secondary | ICD-10-CM

## 2022-05-03 NOTE — Discharge Instructions (Addendum)
Your x-ray shows that you have an increased amount of stool on the right side of your abdomen but the left side of your abdomen does not contain any stool and the bowel is empty.  I recommend continue your MiraLAX.  You can even take a double dose (2 capfuls) in 16 ounces of a beverage of your choice this morning.  I would couple this with 2 over-the-counter Dulcolax tablets to try and remove that stool buildup in your colon and relieve your constipation.  If you develop any abdominal swelling, abdominal pain, nausea or vomiting, or fever you need to go to the ER for evaluation.

## 2022-05-03 NOTE — ED Triage Notes (Addendum)
Pt c/o constipation x7 days. Pt states he did try miralax Sunday night, Monday night, and Tuesday night. Pt states didn't really help.

## 2022-05-03 NOTE — ED Provider Notes (Signed)
[facility]    CSN: [id number] Arrival date & time: 05/03/22  0800      History   Chief Complaint Chief Complaint  Patient presents with   Constipation    HPI Nathaniel Shaw is a 66 y.o. male.   HPI  72 old male here for evaluation of constipation.  The patient reports that he has not had a bowel movement in the last 7 days despite taking MiraLAX.  He states that 9 days ago he contracted a GI illness and was up every hour having diarrhea stools.  He denies taking any Imodium to stop the diarrhea.  He has had a decreased appetite but he denies any abdominal pain, abdominal bloating, nausea or vomiting.  He has no history of constipation, IBS, or narcotic pain medication usage.  Past Medical History:  Diagnosis Date   OSA on CPAP    Restless leg syndrome     Patient Active Problem List   Diagnosis Date Noted   Hereditary spherocytosis ([place]) 10/16/2021   Elevated blood pressure reading without diagnosis of hypertension 10/16/2021   CPAP use counseling 05/09/2020   OSA on CPAP    Restless leg syndrome     Past Surgical History:  Procedure Laterality Date   KNEE ARTHROSCOPY         Home Medications    Prior to Admission medications   Medication Sig Start Date End Date Taking? Authorizing Provider  lisinopril (ZESTRIL) 5 MG tablet Take 5 mg by mouth 2 (two) times daily. 11/17/21  Yes [provider]  meloxicam (MOBIC) 15 MG tablet Take 15 mg by mouth daily. 04/19/22  Yes [provider]  traZODone (DESYREL) 50 MG tablet Take 50 mg by mouth at bedtime. 11/17/21  Yes [provider]  benzonatate (TESSALON) 200 MG capsule Take 1 capsule (200 mg total) by mouth 3 (three) times daily as needed for cough. 04/22/22   Danton Clap, PA-C  fexofenadine (ALLEGRA) 60 MG tablet Take 1 tablet (60 mg total) by mouth 2 (two) times daily. 01/21/22 02/20/22  Margarette Canada, NP  ipratropium (ATROVENT) 0.06 % nasal spray Place 2 sprays into both  nostrils 4 (four) times daily. 04/22/22   Laurene Footman B, PA-C  azelastine (ASTELIN) 0.1 % nasal spray Place into the nose. 06/28/17 11/04/20  [provider]    Family History Family History  Problem Relation Age of Onset   Cancer Mother    Healthy Father     Social History Social History   Tobacco Use   Smoking status: Never   Smokeless tobacco: Never  Vaping Use   Vaping Use: Never used  Substance Use Topics   Alcohol use: Yes    Comment: occasional   Drug use: Never     Allergies   Patient has no known allergies.   Review of Systems Review of Systems  Constitutional:  Negative for fever.  Gastrointestinal:  Positive for constipation and diarrhea. Negative for abdominal distention, abdominal pain, nausea and vomiting.     Physical Exam Triage Vital Signs ED Triage Vitals [05/03/22 0808]  Enc Vitals Group     BP      Pulse      Resp      Temp      Temp src      SpO2      Weight 202 lb (91.6 kg)     Height '5\' 9"'$  (1.753 m)     Head Circumference      Peak  Flow      Pain Score 4     Pain Loc      Pain Edu?      Excl. in [place]?    No data found.  Updated Vital Signs BP (!) 162/77 (BP Location: Left Arm)   Pulse 88   Temp 99.1 F (37.3 C) (Oral)   Ht '5\' 9"'$  (1.753 m)   Wt 202 lb (91.6 kg)   SpO2 98%   BMI 29.83 kg/m   Visual Acuity Right Eye Distance:   Left Eye Distance:   Bilateral Distance:    Right Eye Near:   Left Eye Near:    Bilateral Near:     Physical Exam Vitals and nursing note reviewed.  Constitutional:      Appearance: Normal appearance. He is not ill-appearing.  HENT:     Head: Normocephalic and atraumatic.  Cardiovascular:     Rate and Rhythm: Normal rate and regular rhythm.     Pulses: Normal pulses.     Heart sounds: Normal heart sounds. No murmur heard.    No friction rub. No gallop.  Pulmonary:     Effort: Pulmonary effort is normal.     Breath sounds: Normal breath sounds. No wheezing, rhonchi or rales.   Abdominal:     General: Abdomen is flat. Bowel sounds are normal. There is no distension.     Palpations: Abdomen is soft.     Tenderness: There is no abdominal tenderness. There is no guarding or rebound.  Skin:    General: Skin is warm and dry.     Capillary Refill: Capillary refill takes less than 2 seconds.     Findings: No erythema or rash.  Neurological:     General: No focal deficit present.     Mental Status: He is alert and oriented to person, place, and time.  Psychiatric:        Mood and Affect: Mood normal.        Behavior: Behavior normal.        Thought Content: Thought content normal.        Judgment: Judgment normal.      UC Treatments / Results  Labs (all labs ordered are listed, but only abnormal results are displayed) Labs Reviewed - No data to display  EKG   Radiology DG Abdomen 1 View  Result Date: 05/03/2022 CLINICAL DATA:  Constipation. EXAM: ABDOMEN - 1 VIEW COMPARISON:  None Available. FINDINGS: The bowel gas pattern is normal. Mildly increased stool burden in the right colon. The left colon appears decompressed. No radio-opaque calculi or other significant radiographic abnormality are seen. Mild bilateral hip osteoarthritis with chondrocalcinosis. IMPRESSION: 1. Mildly increased stool burden in the right colon. Electronically Signed   By: Titus Dubin M.D.   On: 05/03/2022 08:55    Procedures Procedures (including critical care time)  Medications Ordered in UC Medications - No data to display  Initial Impression / Assessment and Plan / UC Course  I have reviewed the triage vital signs and the nursing notes.  Pertinent labs & imaging results that were available during my care of the patient were reviewed by me and considered in my medical decision making (see chart for details).   Patient is a healthy-appearing 66 year old male here for evaluation of constipation as he has not had a bowel movement in the last 7 days.  He states he has been  eating but his appetite has been decreased.  He has used over-the-counter MiraLAX which has not  produced any results.  He does not appear to be any discomfort and there does not appear to be any abdominal bloating.  His abdomen is soft and nontender with positive bowel sounds in all 4 quadrants.  I will obtain a KUB to evaluate for the presence of constipation or bowel dilatation.  KUB independently reviewed and evaluated by me.  Impression: Patient does have a scattered stool burden and significant present in the ascending and transverse colon.  Descending colon appears to be empty.  There are nonspecific air-fluid levels present.  There does not appear to be a ball of stool present in the rectal vault as well.  Radiology overread is pending. Radiology impression states that the bowel gas pattern is normal and there is mildly increased stool burden in the right colon.  The left colon appears decompressed.  I will discharge patient home with a diagnosis of constipation and have him add Dulcolax suppositories to his MiraLAX to see if he can evacuate the remaining stool in his colon.  If he is unable to have a bowel movement, develops abdominal pain or distention, or develops a fever I will have him go to the ER for evaluation.   Final Clinical Impressions(s) / UC Diagnoses   Final diagnoses:  Constipation, unspecified constipation type     Discharge Instructions      Your x-ray shows that you have an increased amount of stool on the right side of your abdomen but the left side of your abdomen does not contain any stool and the bowel is empty.  I recommend continue your MiraLAX.  You can even take a double dose (2 capfuls) in 16 ounces of a beverage of your choice this morning.  I would couple this with 2 over-the-counter Dulcolax tablets to try and remove that stool buildup in your colon and relieve your constipation.  If you develop any abdominal swelling, abdominal pain, nausea or vomiting, or  fever you need to go to the ER for evaluation.     ED Prescriptions   None    PDMP not reviewed this encounter.   Margarette Canada, NP 05/03/22 0900

## 2022-05-05 ENCOUNTER — Emergency Department
Admission: EM | Admit: 2022-05-05 | Discharge: 2022-05-05 | Disposition: A | Discharge status: Home / Self Care | Payer: Medicare Other | Attending: Emergency Medicine | Admitting: Emergency Medicine

## 2022-05-05 ENCOUNTER — Other Ambulatory Visit: Payer: Self-pay

## 2022-05-05 ENCOUNTER — Emergency Department: Payer: Medicare Other

## 2022-05-05 ENCOUNTER — Encounter: Payer: Self-pay | Admitting: Emergency Medicine

## 2022-05-05 DIAGNOSIS — I10 Essential (primary) hypertension: Secondary | ICD-10-CM | POA: Insufficient documentation

## 2022-05-05 DIAGNOSIS — K5900 Constipation, unspecified: Secondary | ICD-10-CM | POA: Insufficient documentation

## 2022-05-05 DIAGNOSIS — R1010 Upper abdominal pain, unspecified: Secondary | ICD-10-CM | POA: Diagnosis present

## 2022-05-05 LAB — COMPREHENSIVE METABOLIC PANEL
ALT: 29 U/L (ref 0–44)
AST: 23 U/L (ref 15–41)
Albumin: 4.8 g/dL (ref 3.5–5.0)
Alkaline Phosphatase: 81 U/L (ref 38–126)
Anion gap: 8 (ref 5–15)
BUN: 14 mg/dL (ref 8–23)
CO2: 25 mmol/L (ref 22–32)
Calcium: 9.4 mg/dL (ref 8.9–10.3)
Chloride: 106 mmol/L (ref 98–111)
Creatinine, Ser: 0.75 mg/dL (ref 0.61–1.24)
GFR, Estimated: >60 mL/min (ref >60)
Glucose, Bld: 103 mg/dL — ABNORMAL HIGH (ref 70–99)
Potassium: 3.8 mmol/L (ref 3.5–5.1)
Sodium: 139 mmol/L (ref 135–145)
Total Bilirubin: 1.6 mg/dL — ABNORMAL HIGH (ref 0.3–1.2)
Total Protein: 7.3 g/dL (ref 6.5–8.1)

## 2022-05-05 LAB — CBC WITH DIFFERENTIAL/PLATELET
Abs Immature Granulocytes: 0.05 10*3/uL (ref 0.00–0.07)
Basophils Absolute: 0 10*3/uL (ref 0.0–0.1)
Basophils Relative: 0 %
Eosinophils Absolute: 0 10*3/uL (ref 0.0–0.5)
Eosinophils Relative: 0 %
HCT: 39.5 % (ref 39.0–52.0)
Hemoglobin: 14 g/dL (ref 13.0–17.0)
Immature Granulocytes: 1 %
Lymphocytes Relative: 14 %
Lymphs Abs: 1.4 10*3/uL (ref 0.7–4.0)
MCH: 30.1 pg (ref 26.0–34.0)
MCHC: 35.4 g/dL (ref 30.0–36.0)
MCV: 84.9 fL (ref 80.0–100.0)
Monocytes Absolute: 0.7 10*3/uL (ref 0.1–1.0)
Monocytes Relative: 7 %
Neutro Abs: 7.6 10*3/uL (ref 1.7–7.7)
Neutrophils Relative %: 78 %
Platelets: 238 10*3/uL (ref 150–400)
RBC: 4.65 MIL/uL (ref 4.22–5.81)
RDW: 14 % (ref 11.5–15.5)
WBC: 9.9 10*3/uL (ref 4.0–10.5)
nRBC: 0 % (ref 0.0–0.2)

## 2022-05-05 LAB — LIPASE, BLOOD: Lipase: 39 U/L (ref 11–51)

## 2022-05-05 LAB — URINALYSIS, ROUTINE W REFLEX MICROSCOPIC
Bilirubin Urine: NEGATIVE
Glucose, UA: NEGATIVE mg/dL
Hgb urine dipstick: NEGATIVE
Ketones, ur: NEGATIVE mg/dL
Leukocytes,Ua: NEGATIVE
Nitrite: NEGATIVE
Protein, ur: NEGATIVE mg/dL
Specific Gravity, Urine: 1.001 — ABNORMAL LOW (ref 1.005–1.030)
pH: 7 (ref 5.0–8.0)

## 2022-05-05 MED ORDER — FLEET ENEMA 7-19 GM/118ML RE ENEM
1.0000 | ENEMA | Freq: Once | RECTAL | Status: AC
  Administered 2022-05-05: 1 via RECTAL

## 2022-05-05 MED ORDER — IOHEXOL 300 MG/ML  SOLN
100.0000 mL | Freq: Once | INTRAMUSCULAR | Status: AC | PRN
  Administered 2022-05-05: 100 mL via INTRAVENOUS

## 2022-05-05 NOTE — ED Triage Notes (Signed)
Pt to ED via POV for constipation. Pt states that he has not used the bathroom in over a week. Pt has tried OTC medication without relief.

## 2022-05-05 NOTE — ED Provider Notes (Signed)
[facility] Provider Note  Patient Contact: 5:13 PM (approximate)   History   Constipation   HPI  Nathaniel Shaw is a 66 y.o. male with a history of essential hypertension, obstructive sleep apnea presents to the emergency department as he has not had a bowel movement in approximately 1 week.  Patient states that he had a viral illness last Wednesday and states he has not had a bowel movement since that time.  He states that he has some mild upper abdominal discomfort but no associated vomiting.  He has had some loose stool with straining and patient is secondarily concerned for some possible internal hemorrhoids.  He denies chest pain, chest tightness or shortness of breath.      Physical Exam   Triage Vital Signs: ED Triage Vitals  Enc Vitals Group     BP 05/05/22 1618 (!) 186/88     Pulse Rate 05/05/22 1618 76     Resp 05/05/22 1618 18     Temp 05/05/22 1618 98.4 F (36.9 C)     Temp Source 05/05/22 1618 Oral     SpO2 05/05/22 1618 98 %     Weight 05/05/22 1618 200 lb (90.7 kg)     Height 05/05/22 1618 '5\' 9"'$  (1.753 m)     Head Circumference --      Peak Flow --      Pain Score 05/05/22 1616 0     Pain Loc --      Pain Edu? --      Excl. in [place]? --     Most recent vital signs: Vitals:   05/05/22 1618 05/05/22 1957  BP: (!) 186/88 (!) 170/80  Pulse: 76 74  Resp: 18 18  Temp: 98.4 F (36.9 C) 98.4 F (36.9 C)  SpO2: 98% 99%     General: Alert and in no acute distress. Eyes:  PERRL. EOMI. Head: No acute traumatic findings ENT:      Nose: No congestion/rhinnorhea.      Mouth/Throat: Mucous membranes are moist. Neck: No stridor. No cervical spine tenderness to palpation. Cardiovascular:  Good peripheral perfusion Respiratory: Normal respiratory effort without tachypnea or retractions. Lungs CTAB. Good air entry to the bases with no decreased or absent breath sounds. Gastrointestinal: Bowel sounds 4 quadrants. Soft and nontender to  palpation. No guarding or rigidity. No palpable masses. No distention. No CVA tenderness. Musculoskeletal: Full range of motion to all extremities.  Neurologic:  No gross focal neurologic deficits are appreciated.  Skin:   No rash noted Other:   ED Results / Procedures / Treatments   Labs (all labs ordered are listed, but only abnormal results are displayed) Labs Reviewed  COMPREHENSIVE METABOLIC PANEL - Abnormal; Notable for the following components:      Result Value   Glucose, Bld 103 (*)    Total Bilirubin 1.6 (*)    All other components within normal limits  URINALYSIS, ROUTINE W REFLEX MICROSCOPIC - Abnormal; Notable for the following components:   Color, Urine COLORLESS (*)    APPearance CLEAR (*)    Specific Gravity, Urine 1.001 (*)    All other components within normal limits  CBC WITH DIFFERENTIAL/PLATELET  LIPASE, BLOOD        RADIOLOGY  I personally viewed and evaluated these images as part of my medical decision making, as well as reviewing the written report by the radiologist.  ED Provider Interpretation:    PROCEDURES:  Critical Care performed: No  Procedures  MEDICATIONS ORDERED IN ED: Medications  iohexol (OMNIPAQUE) 300 MG/ML solution 100 mL (100 mLs Intravenous Contrast Given 05/05/22 1803)  sodium phosphate (FLEET) 7-19 GM/118ML enema 1 enema (1 enema Rectal Given 05/05/22 1859)     IMPRESSION / MDM / ASSESSMENT AND PLAN / ED COURSE  I reviewed the triage vital signs and the nursing notes.                              Assessment and plan Abdominal pain Constipation 66 year old male with no past medical history of constipation presents to the emergency department after lack of bowel movement for 1 week.  Patient was hypertensive at triage but vital signs otherwise reassuring.  Abdomen was soft and nontender with no guarding.  Differential diagnosis includes constipation, small bowel obstruction, nephrolithiasis, intra-abdominal  abscess, cholecystitis...  CBC, CMP and UA unremarkable.  Lipase within range.  CT abdomen pelvis unremarkable.  Patient received a Fleet enema and was able to have a bowel movement in the emergency department.  Recommended continuing MiraLAX daily.  Return precautions were given to return with new or worsening symptoms.      FINAL CLINICAL IMPRESSION(S) / ED DIAGNOSES   Final diagnoses:  Constipation, unspecified constipation type     Rx / DC Orders   ED Discharge Orders     None        Note:  This document was prepared using Dragon voice recognition software and may include unintentional dictation errors.   Vallarie Mare [place], PA-C 05/05/22 2257    Rada Hay, MD 05/07/22 [phone]

## 2022-05-05 NOTE — ED Notes (Signed)
Patient states he has taken Miralax and Dulcolax without relief. Patient was also seen at Urgent care for same complaint.  Patient c/o nausea this morning, but denies vomiting.

## 2022-05-05 NOTE — Discharge Instructions (Addendum)
Take MiraLAX once daily, 1 capful in your favorite drink.

## 2022-06-06 ENCOUNTER — Encounter: Payer: Self-pay | Admitting: Nurse Practitioner

## 2022-06-07 ENCOUNTER — Encounter: Payer: Self-pay | Admitting: Nurse Practitioner

## 2022-06-07 DIAGNOSIS — Z87898 Personal history of other specified conditions: Secondary | ICD-10-CM

## 2022-06-07 DIAGNOSIS — R630 Anorexia: Secondary | ICD-10-CM

## 2022-06-07 DIAGNOSIS — R634 Abnormal weight loss: Secondary | ICD-10-CM

## 2022-06-08 ENCOUNTER — Other Ambulatory Visit: Payer: Self-pay | Admitting: Nurse Practitioner

## 2022-06-08 DIAGNOSIS — R911 Solitary pulmonary nodule: Secondary | ICD-10-CM

## 2022-06-08 DIAGNOSIS — R634 Abnormal weight loss: Secondary | ICD-10-CM

## 2022-06-14 ENCOUNTER — Ambulatory Visit
Admission: RE | Admit: 2022-06-14 | Discharge: 2022-06-14 | Disposition: A | Discharge status: Home / Self Care | Payer: Medicare Other | Source: Ambulatory Visit | Attending: Orthopedic Surgery | Admitting: Orthopedic Surgery

## 2022-06-14 ENCOUNTER — Other Ambulatory Visit: Payer: Self-pay | Admitting: Orthopedic Surgery

## 2022-06-14 DIAGNOSIS — D492 Neoplasm of unspecified behavior of bone, soft tissue, and skin: Secondary | ICD-10-CM | POA: Diagnosis present

## 2022-11-12 ENCOUNTER — Encounter: Payer: Self-pay | Admitting: Emergency Medicine

## 2022-11-12 ENCOUNTER — Ambulatory Visit
Admission: EM | Admit: 2022-11-12 | Discharge: 2022-11-12 | Disposition: A | Discharge status: Home / Self Care | Payer: Medicare Other

## 2022-11-12 DIAGNOSIS — R21 Rash and other nonspecific skin eruption: Secondary | ICD-10-CM | POA: Diagnosis not present

## 2022-11-12 DIAGNOSIS — W57XXXA Bitten or stung by nonvenomous insect and other nonvenomous arthropods, initial encounter: Secondary | ICD-10-CM | POA: Diagnosis not present

## 2022-11-12 DIAGNOSIS — S80862A Insect bite (nonvenomous), left lower leg, initial encounter: Secondary | ICD-10-CM | POA: Diagnosis not present

## 2022-11-12 MED ORDER — DOXYCYCLINE HYCLATE 100 MG PO TABS
200.0000 mg | ORAL_TABLET | Freq: Once | ORAL | Status: AC
  Administered 2022-11-12: 200 mg via ORAL

## 2022-11-12 MED ORDER — TRIAMCINOLONE ACETONIDE 0.1 % EX CREA: 1.0000 | TOPICAL_CREAM | Freq: Two times a day (BID) | CUTANEOUS | 0 refills | Status: AC

## 2022-11-12 NOTE — ED Triage Notes (Signed)
Pt removed a tick from his left shin 2 days ago. He has redness and pain around the site.

## 2022-11-12 NOTE — Discharge Instructions (Addendum)
-  You have been given a 1 time prophylactic dose of doxycycline to prevent against tick diseases and I have sent a corticosteroid ointment to the pharmacy to apply to the site. You may also use ice.  -Return for signs of infection or concerns about possible tick illness (fever, fatigue, flu-like symptoms, joint aches, spreading rash).

## 2022-11-12 NOTE — ED Provider Notes (Signed)
[facility]    CSN: [id number] Arrival date & time: 11/12/22  4098      History   Chief Complaint Chief Complaint  Patient presents with   Tick Removal    HPI Nathaniel Shaw is a 67 y.o. male presenting for evaluation of rash related to tick bite.  Patient says he pulled the tick off his left shin a couple days ago.  He reports that he has redness and purpling of the skin.  Denies erythema migrans type rash.  No other areas of rash.  No fever, chills, joint aches.  Patient says he overall feels well.  The site is still itchy but denies pain.  He reports that he brought the tick with him.  It is small and has a tiny white dot on it's body.  HPI  Past Medical History:  Diagnosis Date   OSA on CPAP    Restless leg syndrome     Patient Active Problem List   Diagnosis Date Noted   Hereditary spherocytosis ([facility]) 10/16/2021   Elevated blood pressure reading without diagnosis of hypertension 10/16/2021   CPAP use counseling 05/09/2020   OSA on CPAP    Restless leg syndrome     Past Surgical History:  Procedure Laterality Date   KNEE ARTHROSCOPY         Home Medications    Prior to Admission medications   Medication Sig Start Date End Date Taking? Authorizing Provider  FLUoxetine (PROZAC) 40 MG capsule Take by mouth. 11/07/22 11/07/23 Yes [provider]  lisinopril (ZESTRIL) 5 MG tablet Take 5 mg by mouth 2 (two) times daily. 11/17/21  Yes [provider]  traZODone (DESYREL) 50 MG tablet Take 50 mg by mouth at bedtime. 11/17/21  Yes [provider]  triamcinolone cream (KENALOG) 0.1 % Apply 1 Application topically 2 (two) times daily. 11/12/22  Yes Shirlee Latch, PA-C  benzonatate (TESSALON) 200 MG capsule Take 1 capsule (200 mg total) by mouth 3 (three) times daily as needed for cough. 04/22/22   Shirlee Latch, PA-C  fexofenadine (ALLEGRA) 60 MG tablet Take 1 tablet (60 mg total) by mouth 2 (two) times daily. 01/21/22 02/20/22  Becky Augusta, NP  ipratropium (ATROVENT) 0.06 % nasal spray Place 2 sprays into both nostrils 4 (four) times daily. 04/22/22   Shirlee Latch, PA-C  meloxicam (MOBIC) 15 MG tablet Take 15 mg by mouth daily. 04/19/22   [provider]  azelastine (ASTELIN) 0.1 % nasal spray Place into the nose. 06/28/17 11/04/20  [provider]    Family History Family History  Problem Relation Age of Onset   Cancer Mother    Healthy Father     Social History Social History   Tobacco Use   Smoking status: Never   Smokeless tobacco: Never  Vaping Use   Vaping Use: Never used  Substance Use Topics   Alcohol use: Yes    Comment: occasional   Drug use: Never     Allergies   Patient has no known allergies.   Review of Systems Review of Systems  Constitutional:  Negative for fatigue and fever.  Musculoskeletal:  Negative for arthralgias and joint swelling.  Skin:  Positive for color change and rash. Negative for wound.  Neurological:  Negative for weakness and numbness.     Physical Exam Triage Vital Signs ED Triage Vitals  Enc Vitals Group     BP      Pulse  Resp      Temp      Temp src      SpO2      Weight      Height      Head Circumference      Peak Flow      Pain Score      Pain Loc      Pain Edu?      Excl. in [place]?    No data found.  Updated Vital Signs BP (!) 171/88 (BP Location: Right Arm)   Pulse 73   Temp 98.2 F (36.8 C) (Oral)   Resp 16   SpO2 98%   Physical Exam Vitals and nursing note reviewed.  Constitutional:      General: He is not in acute distress.    Appearance: Normal appearance. He is well-developed. He is not ill-appearing.  HENT:     Head: Normocephalic and atraumatic.  Eyes:     General: No scleral icterus.    Conjunctiva/sclera: Conjunctivae normal.  Cardiovascular:     Rate and Rhythm: Normal rate and regular rhythm.     Pulses: Normal pulses.  Pulmonary:     Effort: Pulmonary effort is normal. No respiratory  distress.     Breath sounds: Normal breath sounds.  Musculoskeletal:     Cervical back: Neck supple.  Skin:    General: Skin is warm and dry.     Capillary Refill: Capillary refill takes less than 2 seconds.     Findings: Rash present.          Comments: Small area of purplish rash anterior left shin with tiny petechia present. Non tender  Neurological:     General: No focal deficit present.     Mental Status: He is alert. Mental status is at baseline.     Motor: No weakness.     Gait: Gait normal.  Psychiatric:        Mood and Affect: Mood normal.        Behavior: Behavior normal.      UC Treatments / Results  Labs (all labs ordered are listed, but only abnormal results are displayed) Labs Reviewed - No data to display  EKG   Radiology No results found.  Procedures Procedures (including critical care time)  Medications Ordered in UC Medications  doxycycline (VIBRA-TABS) tablet 200 mg (has no administration in time range)    Initial Impression / Assessment and Plan / UC Course  I have reviewed the triage vital signs and the nursing notes.  Pertinent labs & imaging results that were available during my care of the patient were reviewed by me and considered in my medical decision making (see chart for details).   67 year old male presents for rash of lower extremities related to tick bite that he noticed 2 days ago.  He reports he developed a rash shortly after falling to golf.  He reports that he feels well.  He brought the tick with him.  His small and black with a tiny white dot in the center of his body.  Patient desires to take prophylaxis for tickborne illness.  Patient given 200 mg oral doxycycline in clinic.  Discussed signs and symptoms relating to tick illness and asked him to return if he develops any of those signs or symptoms or follow-up with PCP.  Will treat the rash with triamcinolone cream.  Also reviewed RICE guidelines.  Reviewed returning for any  signs of infection or other concerns.   Final Clinical Impressions(s) /  UC Diagnoses   Final diagnoses:  Rash and nonspecific skin eruption  Tick bite of left lower leg, initial encounter     Discharge Instructions      -You have been given a 1 time prophylactic dose of doxycycline to prevent against tick diseases and I have sent a corticosteroid ointment to the pharmacy to apply to the site. You may also use ice.  -Return for signs of infection or concerns about possible tick illness (fever, fatigue, flu-like symptoms, joint aches, spreading rash).     ED Prescriptions     Medication Sig Dispense Auth. Provider   triamcinolone cream (KENALOG) 0.1 % Apply 1 Application topically 2 (two) times daily. 30 g Gareth Morgan      PDMP not reviewed this encounter.   Shirlee Latch, PA-C 11/12/22 [phone]

## 2022-12-21 NOTE — Progress Notes (Signed)
[facility] [address] [place], [place] [address]  Pulmonary Sleep Medicine   Office Visit Note  Patient Name: Nathaniel Shaw DOB: [birth year] MRN [id number]    Chief Complaint: Obstructive Sleep Apnea visit  Brief History:  [place] is seen today for an annual follow up visit for CPAP@ 10 cmH2O. The patient has a 8.5 year history of sleep apnea. Patient is using PAP nightly.  The patient feels rested after sleeping with PAP.  The patient reports benefiting from PAP use. Reported sleepiness is  improved and the Epworth Sleepiness Score is 2 out of 24. The patient very rarely will take naps. The patient complains of the following: none.  The compliance download shows 96% compliance with an average use time of 7 hours 27 minutes. The AHI is 3.8.  The patient does not complain  of limb movements disrupting sleep. The patient continues to require PAP therapy in order to eliminate sleep apnea.   ROS  General: (-) fever, (-) chills, (-) night sweat Nose and Sinuses: (-) nasal stuffiness or itchiness, (-) postnasal drip, (-) nosebleeds, (-) sinus trouble. Mouth and Throat: (-) sore throat, (-) hoarseness. Neck: (-) swollen glands, (-) enlarged thyroid, (-) neck pain. Respiratory: - cough, - shortness of breath, - wheezing. Neurologic: - numbness, - tingling. Psychiatric: - anxiety, - depression   Current Medication: Outpatient Encounter Medications as of 12/24/2022  Medication Sig   buPROPion (WELLBUTRIN XL) 150 MG 24 hr tablet Take by mouth.   lisinopril (ZESTRIL) 10 MG tablet Take 1 tablet by mouth 2 (two) times daily.   FLUoxetine (PROZAC) 40 MG capsule Take by mouth.   triamcinolone cream (KENALOG) 0.1 % Apply 1 Application topically 2 (two) times daily.   [DISCONTINUED] azelastine (ASTELIN) 0.1 % nasal spray Place into the nose.   [DISCONTINUED] benzonatate (TESSALON) 200 MG capsule Take 1 capsule (200 mg total) by mouth 3 (three) times daily as needed for cough.    [DISCONTINUED] fexofenadine (ALLEGRA) 60 MG tablet Take 1 tablet (60 mg total) by mouth 2 (two) times daily.   [DISCONTINUED] ipratropium (ATROVENT) 0.06 % nasal spray Place 2 sprays into both nostrils 4 (four) times daily.   [DISCONTINUED] lisinopril (ZESTRIL) 5 MG tablet Take 5 mg by mouth 2 (two) times daily.   [DISCONTINUED] meloxicam (MOBIC) 15 MG tablet Take 15 mg by mouth daily.   [DISCONTINUED] traZODone (DESYREL) 50 MG tablet Take 50 mg by mouth at bedtime.   No facility-administered encounter medications on file as of 12/24/2022.    Surgical History: Past Surgical History:  Procedure Laterality Date   KNEE ARTHROSCOPY      Medical History: Past Medical History:  Diagnosis Date   OSA on CPAP    Restless leg syndrome     Family History: Non contributory to the present illness  Social History: Social History   Socioeconomic History   Marital status: Married    Spouse name: Not on file   Number of children: Not on file   Years of education: Not on file   Highest education level: Not on file  Occupational History   Not on file  Tobacco Use   Smoking status: Never   Smokeless tobacco: Never  Vaping Use   Vaping Use: Never used  Substance and Sexual Activity   Alcohol use: Yes    Comment: occasional   Drug use: Never   Sexual activity: Not on file  Other Topics Concern   Not on file  Social History Narrative   Not  on file   Social Determinants of Health   Financial Resource Strain: Not on file  Food Insecurity: Not on file  Transportation Needs: Not on file  Physical Activity: Not on file  Stress: Not on file  Social Connections: Not on file  Intimate Partner Violence: Not on file    Vital Signs: Blood pressure (!) 159/92, pulse 84, resp. rate 16, height 5' 8.5" (1.74 m), weight 180 lb (81.6 kg), SpO2 96 %. Body mass index is 26.97 kg/m.    Examination: General Appearance: The patient is well-developed, well-nourished, and in no distress. Neck  Circumference: 40 cm Skin: Gross inspection of skin unremarkable. Head: normocephalic, no gross deformities. Eyes: no gross deformities noted. ENT: ears appear grossly normal Neurologic: Alert and oriented. No involuntary movements.  STOP BANG RISK ASSESSMENT S (snore) Have you been told that you snore?     NO   T (tired) Are you often tired, fatigued, or sleepy during the day?   NO  O (obstruction) Do you stop breathing, choke, or gasp during sleep? NO   P (pressure) Do you have or are you being treated for high blood pressure? YES   B (BMI) Is your body index greater than 35 kg/m? NO   A (age) Are you 68 years old or older? YES   N (neck) Do you have a neck circumference greater than 16 inches?   YES   G (gender) Are you a male? YES   TOTAL STOP/BANG "YES" ANSWERS 4       A STOP-Bang score of 2 or less is considered low risk, and a score of 5 or more is high risk for having either moderate or severe OSA. For people who score 3 or 4, doctors may need to perform further assessment to determine how likely they are to have OSA.         EPWORTH SLEEPINESS SCALE:  Scale:  (0)= no chance of dozing; (1)= slight chance of dozing; (2)= moderate chance of dozing; (3)= high chance of dozing  Chance  Situtation    Sitting and reading: 0    Watching TV: 1    Sitting Inactive in public: 0    As a passenger in car: 1      Lying down to rest: 0    Sitting and talking: 0    Sitting quielty after lunch: 0    In a car, stopped in traffic: 0   TOTAL SCORE:   2 out of 24    SLEEP STUDIES:  PSG (10/2013) AHI 17/hr, Supine AHI 71/hr, min Spo2 83%, PLM 25 Titration (10/2013) CPAP@ 8 cmH2O   CPAP COMPLIANCE DATA:  Date Range: 12/20/2021-12/19/2022  Average Daily Use: 7 hours 27 minutes   Median Use: 7 hours 24 minutes  Compliance for > 4 Hours: 96%  AHI: 3.8 respiratory events per hour  Days Used: 350/365 days  Mask Leak: 6.2  95th Percentile Pressure:  10         LABS: No results found for this or any previous visit (from the past 2160 hour(s)).  Radiology: No results found.  No results found.  No results found.    Assessment and Plan: Patient Active Problem List   Diagnosis Date Noted   Hereditary spherocytosis (HCC) 10/16/2021   Elevated blood pressure reading without diagnosis of hypertension 10/16/2021   CPAP use counseling 05/09/2020   OSA on CPAP    Restless leg syndrome    1. OSA on CPAP The patient does tolerate  PAP and reports  benefit from PAP use. He is feeling like he is not getting enough air. His download shows more apneas some nights. Leak is controlled. We will try APAP 8-16, and do download in 2 weeks. If no improvement will plan for titration. The patient was reminded how to clean equipment and advised to replace supplies routinely. The patient was also counselled on weight loss. The compliance is excellent. The AHI is 3.8.   OSA on cpap- variable control. Continue with excellent compliance with pap. Change to APAP 8-16. 2 week download.  CPAP continues to be medically necessary to treat this patient's OSA. F/u one year.     2. CPAP use counseling CPAP Counseling: had a lengthy discussion with the patient regarding the importance of PAP therapy in management of the sleep apnea. Patient appears to understand the risk factor reduction and also understands the risks associated with untreated sleep apnea. Patient will try to make a good faith effort to remain compliant with therapy. Also instructed the patient on proper cleaning of the device including the water must be changed daily if possible and use of distilled water is preferred. Patient understands that the machine should be regularly cleaned with appropriate recommended cleaning solutions that do not damage the PAP machine for example given white vinegar and water rinses. Other methods such as ozone treatment may not be as good as these simple methods to  achieve cleaning.   3. Restless leg syndrome Denies complaints at this time     General Counseling: I have discussed the findings of the evaluation and examination with Chrissie Noa.  I have also discussed any further diagnostic evaluation thatmay be needed or ordered today. Kevan verbalizes understanding of the findings of todays visit. We also reviewed his medications today and discussed drug interactions and side effects including but not limited excessive drowsiness and altered mental states. We also discussed that there is always a risk not just to him but also people around him. he has been encouraged to call the office with any questions or concerns that should arise related to todays visit.  No orders of the defined types were placed in this encounter.       I have personally obtained a history, examined the patient, evaluated laboratory and imaging results, formulated the assessment and plan and placed orders. This patient was seen today by Emmaline Kluver, PA-C in collaboration with Dr. Freda Munro.   Yevonne Pax, MD [facility] Diplomate ABMS Pulmonary Critical Care Medicine and Sleep Medicine

## 2022-12-24 ENCOUNTER — Ambulatory Visit (INDEPENDENT_AMBULATORY_CARE_PROVIDER_SITE_OTHER): Payer: Medicare Other | Admitting: Internal Medicine

## 2022-12-24 VITALS — BP 159/92 | HR 84 | Resp 16 | Ht 68.5 in | Wt 180.0 lb

## 2022-12-24 DIAGNOSIS — G2581 Restless legs syndrome: Secondary | ICD-10-CM | POA: Diagnosis not present

## 2022-12-24 DIAGNOSIS — Z7189 Other specified counseling: Secondary | ICD-10-CM

## 2022-12-24 DIAGNOSIS — G4733 Obstructive sleep apnea (adult) (pediatric): Secondary | ICD-10-CM

## 2022-12-24 NOTE — Patient Instructions (Signed)

## 2023-02-13 ENCOUNTER — Ambulatory Visit
Admission: RE | Admit: 2023-02-13 | Discharge: 2023-02-13 | Disposition: A | Discharge status: Home / Self Care | Payer: Medicare Other | Source: Ambulatory Visit

## 2023-02-13 VITALS — BP 159/84 | HR 70 | Temp 99.2°F | Resp 16 | Ht 69.0 in | Wt 180.0 lb

## 2023-02-13 DIAGNOSIS — T24231A Burn of second degree of right lower leg, initial encounter: Secondary | ICD-10-CM

## 2023-02-13 MED ORDER — CEPHALEXIN 500 MG PO CAPS: 500.0000 mg | ORAL_CAPSULE | Freq: Two times a day (BID) | ORAL | 0 refills | Status: AC

## 2023-02-13 NOTE — Discharge Instructions (Signed)
Recommend start Keflex 500mg  twice a day with food to help prevent and treatment mild skin infection. May continue to wash area gently with soap and water. Apply OTC Bacitracin ointment to area and cover with 3 by 4 inch gauze and kling or roller gauze for protection. May take OTC Tylenol 1000mg  every 8 hours as needed for pain. Continue to monitor area. If increase in redness, drainage, pain or any fever develops, return for recheck.
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
## 2023-02-18 ENCOUNTER — Ambulatory Visit
Admission: RE | Admit: 2023-02-18 | Discharge: 2023-02-18 | Disposition: A | Discharge status: Home / Self Care | Payer: Medicare Other | Source: Ambulatory Visit | Attending: Family Medicine | Admitting: Family Medicine

## 2023-02-18 VITALS — BP 146/69 | HR 69 | Temp 98.8°F | Resp 16

## 2023-02-18 DIAGNOSIS — T24231D Burn of second degree of right lower leg, subsequent encounter: Secondary | ICD-10-CM | POA: Diagnosis not present
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
## 2023-12-27 NOTE — Progress Notes (Signed)
 [facility] [address] [place], [place] [address]  Pulmonary Sleep Medicine   Office Visit Note  Patient Name: Nathaniel Shaw DOB: Dec 11, 1955 MRN [id number]    Chief Complaint: Obstructive Sleep Apnea visit  Brief History:  Nathaniel Shaw is seen today for an annual follow up visit for CPAP@ 10 cmH2O. The patient has a 9.5 year history of sleep apnea. Patient is using PAP nightly.  The patient feels rested after sleeping with PAP.  The patient reports benefiting from PAP use. Reported sleepiness is  iproved and the Epworth Sleepiness Score is 5 out of 24. The patient will occasionally take naps. The patient complains of the following: pt is in need of new supplies.  The compliance download shows 92% compliance with an average use time of 8 hours 14 minutes. The AHI is 1.9.  The patient does not complain of limb movements disrupting sleep. The patient continues to require PAP therapy in order to eliminate sleep apnea.   ROS  General: (-) fever, (-) chills, (-) night sweat Nose and Sinuses: (-) nasal stuffiness or itchiness, (-) postnasal drip, (-) nosebleeds, (-) sinus trouble. Mouth and Throat: (-) sore throat, (-) hoarseness. Neck: (-) swollen glands, (-) enlarged thyroid, (-) neck pain. Respiratory: + cough, - shortness of breath, - wheezing. Neurologic: - numbness, - tingling. Psychiatric: + anxiety, - depression   Current Medication: Outpatient Encounter Medications as of 12/30/2023  Medication Sig   amLODipine (NORVASC) 5 MG tablet Take 7.5 mg by mouth.   buPROPion ER (WELLBUTRIN SR) 100 MG 12 hr tablet TAKE 1 TWICE DAILYAT 8AM AND 1PM   sertraline (ZOLOFT) 50 MG tablet Take 50 mg by mouth.   cyanocobalamin (VITAMIN B12) 1000 MCG/ML injection SMARTSIG:1 Injection IM Every 2 Weeks   lisinopril (ZESTRIL) 10 MG tablet Take 1 tablet by mouth 2 (two) times daily.   traZODone (DESYREL) 100 MG tablet Take 100 mg by mouth at bedtime as needed.   triamcinolone  cream  (KENALOG ) 0.1 % Apply 1 Application topically 2 (two) times daily.   [DISCONTINUED] ARIPiprazole (ABILIFY) 5 MG tablet Take by mouth.   [DISCONTINUED] azelastine (ASTELIN) 0.1 % nasal spray Place into the nose.   [DISCONTINUED] buPROPion (WELLBUTRIN XL) 150 MG 24 hr tablet Take by mouth.   [DISCONTINUED] FLUoxetine (PROZAC) 40 MG capsule Take by mouth.   No facility-administered encounter medications on file as of 12/30/2023.    Surgical History: Past Surgical History:  Procedure Laterality Date   KNEE ARTHROSCOPY      Medical History: Past Medical History:  Diagnosis Date   OSA on CPAP    Restless leg syndrome     Family History: Non contributory to the present illness  Social History: Social History   Socioeconomic History   Marital status: Married    Spouse name: Not on file   Number of children: Not on file   Years of education: Not on file   Highest education level: Not on file  Occupational History   Not on file  Tobacco Use   Smoking status: Never   Smokeless tobacco: Never  Vaping Use   Vaping status: Never Used  Substance and Sexual Activity   Alcohol use: Yes    Comment: occasional   Drug use: Never   Sexual activity: Not on file  Other Topics Concern   Not on file  Social History Narrative   Not on file   Social Drivers of Health   Financial Resource Strain: Not on file  Food Insecurity: Not  on file  Transportation Needs: Not on file  Physical Activity: Not on file  Stress: Not on file  Social Connections: Not on file  Intimate Partner Violence: Not on file    Vital Signs: Blood pressure (!) 167/91, pulse 78, resp. rate 16, height 5' 8 (1.727 m), weight 186 lb (84.4 kg), SpO2 98%. Body mass index is 28.28 kg/m.    Examination: General Appearance: The patient is well-developed, well-nourished, and in no distress. Neck Circumference: 40 cm Skin: Gross inspection of skin unremarkable. Head: normocephalic, no gross deformities. Eyes: no  gross deformities noted. ENT: ears appear grossly normal Neurologic: Alert and oriented. No involuntary movements.  STOP BANG RISK ASSESSMENT S (snore) Have you been told that you snore?     NO   T (tired) Are you often tired, fatigued, or sleepy during the day?   YES  O (obstruction) Do you stop breathing, choke, or gasp during sleep? NO   P (pressure) Do you have or are you being treated for high blood pressure? YES   B (BMI) Is your body index greater than 35 kg/m? NO   A (age) Are you 74 years old or older? YES   N (neck) Do you have a neck circumference greater than 16 inches?   YES   G (gender) Are you a male? YES   TOTAL STOP/BANG "YES" ANSWERS 5       A STOP-Bang score of 2 or less is considered low risk, and a score of 5 or more is high risk for having either moderate or severe OSA. For people who score 3 or 4, doctors may need to perform further assessment to determine how likely they are to have OSA.         EPWORTH SLEEPINESS SCALE:  Scale:  (0)= no chance of dozing; (1)= slight chance of dozing; (2)= moderate chance of dozing; (3)= high chance of dozing  Chance  Situtation    Sitting and reading: 1    Watching TV: 1    Sitting Inactive in public: 0    As a passenger in car: 1      Lying down to rest: 1    Sitting and talking: 0    Sitting quielty after lunch: 1    In a car, stopped in traffic: 0   TOTAL SCORE:   5 out of 24    SLEEP STUDIES:  PSG (10/2013) AHI 17/hr, Supine AHI 71/hr, min Spo2 83%, PLM 25 Titration (10/2013) CPAP@ 8 cmH2O   CPAP COMPLIANCE DATA:  Date Range: 12/27/2022-12/26/2023  Average Daily Use: 8 hours 14 minutes  Median Use: 8 hours 18 minutes  Compliance for > 4 Hours: 92%  AHI: 1.9 respiratory events per hour  Days Used: 337/365 days  Mask Leak: 9.9  95th Percentile Pressure: 15         LABS: No results found for this or any previous visit (from the past 2160 hours).  Radiology: No results  found.  No results found.  No results found.    Assessment and Plan: Patient Active Problem List   Diagnosis Date Noted   Hereditary spherocytosis (HCC) 10/16/2021   Elevated blood pressure reading without diagnosis of hypertension 10/16/2021   CPAP use counseling 05/09/2020   OSA on CPAP    Restless leg syndrome     1. OSA on CPAP (Primary) The patient does tolerate PAP and reports  benefit from PAP use. The patient was reminded how to clean equipment and advised to replace  supplies routinely. We had adjusted his pressure last year from fixed at 10 to APAP and the APAP is doing better for his apnea. . The patient was also counselled on weight loss. The compliance is very good. The AHI is 1.9.   OSA on cpap- controlled. Continue with excellent compliance with pap. CPAP continues to be medically necessary to treat this patient's OSA. F/u one year.    2. CPAP use counseling CPAP Counseling: had a lengthy discussion with the patient regarding the importance of PAP therapy in management of the sleep apnea. Patient appears to understand the risk factor reduction and also understands the risks associated with untreated sleep apnea. Patient will try to make a good faith effort to remain compliant with therapy. Also instructed the patient on proper cleaning of the device including the water must be changed daily if possible and use of distilled water is preferred. Patient understands that the machine should be regularly cleaned with appropriate recommended cleaning solutions that do not damage the PAP machine for example given white vinegar and water rinses. Other methods such as ozone treatment may not be as good as these simple methods to achieve cleaning.     General Counseling: I have discussed the findings of the evaluation and examination with Elsie.  I have also discussed any further diagnostic evaluation thatmay be needed or ordered today. Taggert verbalizes understanding of the  findings of todays visit. We also reviewed his medications today and discussed drug interactions and side effects including but not limited excessive drowsiness and altered mental states. We also discussed that there is always a risk not just to him but also people around him. he has been encouraged to call the office with any questions or concerns that should arise related to todays visit.  No orders of the defined types were placed in this encounter.       I have personally obtained a history, examined the patient, evaluated laboratory and imaging results, formulated the assessment and plan and placed orders. This patient was seen today by Lauraine Lay, PA-C in collaboration with Dr. Elfreda Bathe.   Elfreda DELENA Bathe, MD [facility] Diplomate ABMS Pulmonary Critical Care Medicine and Sleep Medicine

## 2023-12-30 ENCOUNTER — Ambulatory Visit (INDEPENDENT_AMBULATORY_CARE_PROVIDER_SITE_OTHER): Admitting: Internal Medicine

## 2023-12-30 VITALS — BP 167/91 | HR 78 | Resp 16 | Ht 68.0 in | Wt 186.0 lb

## 2023-12-30 DIAGNOSIS — Z7189 Other specified counseling: Secondary | ICD-10-CM | POA: Diagnosis not present

## 2023-12-30 DIAGNOSIS — G4733 Obstructive sleep apnea (adult) (pediatric): Secondary | ICD-10-CM

## 2023-12-30 NOTE — Patient Instructions (Signed)
